# Patient Record
Sex: Female | Born: 1970 | Race: Black or African American | Hispanic: No | Marital: Married | Smoking: Never smoker
Health system: Southern US, Community
[De-identification: ages and names within clinical notes are randomized; demographics above are authoritative.]

## PROBLEM LIST (undated history)

## (undated) DIAGNOSIS — D649 Anemia, unspecified: Secondary | ICD-10-CM

## (undated) DIAGNOSIS — Z8489 Family history of other specified conditions: Secondary | ICD-10-CM

## (undated) DIAGNOSIS — C801 Malignant (primary) neoplasm, unspecified: Secondary | ICD-10-CM

---

## 2003-08-07 ENCOUNTER — Other Ambulatory Visit: Admission: RE | Admit: 2003-08-07 | Discharge: 2003-08-07 | Payer: Self-pay | Admitting: Family Medicine

## 2005-07-15 ENCOUNTER — Other Ambulatory Visit: Admission: RE | Admit: 2005-07-15 | Discharge: 2005-07-15 | Payer: Self-pay | Admitting: Family Medicine

## 2007-07-21 ENCOUNTER — Other Ambulatory Visit: Admission: RE | Admit: 2007-07-21 | Discharge: 2007-07-21 | Payer: Self-pay | Admitting: Family Medicine

## 2007-09-01 ENCOUNTER — Encounter: Admission: RE | Admit: 2007-09-01 | Discharge: 2007-09-01 | Payer: Self-pay | Admitting: Family Medicine

## 2008-12-05 ENCOUNTER — Encounter: Admission: RE | Admit: 2008-12-05 | Discharge: 2008-12-05 | Payer: Self-pay | Admitting: Family Medicine

## 2009-05-13 ENCOUNTER — Other Ambulatory Visit: Admission: RE | Admit: 2009-05-13 | Discharge: 2009-05-13 | Payer: Self-pay | Admitting: Family Medicine

## 2009-12-27 ENCOUNTER — Encounter: Admission: RE | Admit: 2009-12-27 | Discharge: 2009-12-27 | Payer: Self-pay | Admitting: Family Medicine

## 2010-10-05 ENCOUNTER — Encounter: Payer: Self-pay | Admitting: Family Medicine

## 2010-12-25 ENCOUNTER — Other Ambulatory Visit: Payer: Self-pay | Admitting: Family Medicine

## 2010-12-25 DIAGNOSIS — Z1231 Encounter for screening mammogram for malignant neoplasm of breast: Secondary | ICD-10-CM

## 2010-12-29 ENCOUNTER — Ambulatory Visit
Admission: RE | Admit: 2010-12-29 | Discharge: 2010-12-29 | Disposition: A | Discharge status: Home / Self Care | Payer: BC Managed Care – PPO | Source: Ambulatory Visit | Attending: Family Medicine | Admitting: Family Medicine

## 2010-12-29 DIAGNOSIS — Z1231 Encounter for screening mammogram for malignant neoplasm of breast: Secondary | ICD-10-CM

## 2013-02-10 ENCOUNTER — Other Ambulatory Visit (HOSPITAL_COMMUNITY)
Admission: RE | Admit: 2013-02-10 | Discharge: 2013-02-10 | Disposition: A | Discharge status: Home / Self Care | Payer: BC Managed Care – PPO | Source: Ambulatory Visit | Attending: Family Medicine | Admitting: Family Medicine

## 2013-02-10 DIAGNOSIS — Z01419 Encounter for gynecological examination (general) (routine) without abnormal findings: Secondary | ICD-10-CM | POA: Insufficient documentation

## 2013-02-10 DIAGNOSIS — Z1151 Encounter for screening for human papillomavirus (HPV): Secondary | ICD-10-CM | POA: Insufficient documentation

## 2014-07-04 ENCOUNTER — Other Ambulatory Visit (HOSPITAL_COMMUNITY)
Admission: RE | Admit: 2014-07-04 | Discharge: 2014-07-04 | Disposition: A | Discharge status: Home / Self Care | Payer: PRIVATE HEALTH INSURANCE | Source: Ambulatory Visit | Attending: Family Medicine | Admitting: Family Medicine

## 2014-07-04 DIAGNOSIS — Z1151 Encounter for screening for human papillomavirus (HPV): Secondary | ICD-10-CM | POA: Diagnosis present

## 2014-07-04 DIAGNOSIS — Z01419 Encounter for gynecological examination (general) (routine) without abnormal findings: Secondary | ICD-10-CM | POA: Insufficient documentation

## 2015-04-29 ENCOUNTER — Other Ambulatory Visit: Payer: Self-pay | Admitting: Obstetrics and Gynecology

## 2015-05-13 ENCOUNTER — Inpatient Hospital Stay (HOSPITAL_COMMUNITY)
Admission: RE | Admit: 2015-05-13 | Discharge: 2015-05-13 | Disposition: A | Discharge status: Home / Self Care | Payer: Self-pay | Source: Ambulatory Visit

## 2015-06-04 ENCOUNTER — Encounter (HOSPITAL_COMMUNITY): Payer: Self-pay

## 2015-06-04 ENCOUNTER — Encounter (HOSPITAL_COMMUNITY)
Admission: RE | Admit: 2015-06-04 | Discharge: 2015-06-04 | Disposition: A | Discharge status: Home / Self Care | Payer: 59 | Source: Ambulatory Visit | Attending: Obstetrics and Gynecology | Admitting: Obstetrics and Gynecology

## 2015-06-04 DIAGNOSIS — Z01818 Encounter for other preprocedural examination: Secondary | ICD-10-CM | POA: Insufficient documentation

## 2015-06-04 HISTORY — DX: Anemia, unspecified: D64.9

## 2015-06-04 LAB — CBC
HCT: 38.9 % (ref 36.0–46.0)
Hemoglobin: 12.8 g/dL (ref 12.0–15.0)
MCH: 28.6 pg (ref 26.0–34.0)
MCHC: 32.9 g/dL (ref 30.0–36.0)
MCV: 87 fL (ref 78.0–100.0)
PLATELETS: 275 10*3/uL (ref 150–400)
RBC: 4.47 MIL/uL (ref 3.87–5.11)
RDW: 14.4 % (ref 11.5–15.5)
WBC: 4.9 10*3/uL (ref 4.0–10.5)

## 2015-06-04 LAB — BASIC METABOLIC PANEL
Anion gap: 6 (ref 5–15)
BUN: 14 mg/dL (ref 6–20)
CALCIUM: 9.5 mg/dL (ref 8.9–10.3)
CO2: 26 mmol/L (ref 22–32)
CREATININE: 0.93 mg/dL (ref 0.44–1.00)
Chloride: 106 mmol/L (ref 101–111)
GFR calc non Af Amer: >60 mL/min (ref >60)
Glucose, Bld: 89 mg/dL (ref 65–99)
Potassium: 3.8 mmol/L (ref 3.5–5.1)
SODIUM: 138 mmol/L (ref 135–145)

## 2015-06-04 NOTE — Patient Instructions (Signed)
Your procedure is scheduled on:  June 14, 2015  Enter through the Main Entrance of [facility] at:  8:15 am   Pick up the phone at the desk and dial [phone].  Call this number if you have problems the morning of surgery: [phone].  Remember: Do NOT eat food:  After midnight Thursday  Do NOT drink clear liquids after:  Midnight on Thursday  Take these medicines the morning of surgery with a SIP OF WATER:  None   Do NOT wear jewelry (body piercing), metal hair clips/bobby pins, make-up, or nail polish. Do NOT wear lotions, powders, or perfumes.  You may wear deoderant. Do NOT shave for 48 hours prior to surgery. Do NOT bring valuables to the hospital. Contacts, dentures, or bridgework may not be worn into surgery. Leave suitcase in car.  After surgery it may be brought to your room.  For patients admitted to the hospital, checkout time is 11:00 AM the day of discharge.

## 2015-06-13 MED ORDER — DEXTROSE 5 % IV SOLN
2.0000 g | INTRAVENOUS | Status: AC
  Administered 2015-06-14: 2 g via INTRAVENOUS
  Filled 2015-06-13: qty 2

## 2015-06-14 ENCOUNTER — Ambulatory Visit (HOSPITAL_COMMUNITY)
Admission: RE | Admit: 2015-06-14 | Discharge: 2015-06-15 | Disposition: A | Discharge status: Home / Self Care | Payer: 59 | Source: Ambulatory Visit | Attending: Obstetrics and Gynecology | Admitting: Obstetrics and Gynecology

## 2015-06-14 ENCOUNTER — Encounter (HOSPITAL_COMMUNITY)
Admission: RE | Disposition: A | Discharge status: Home / Self Care | Payer: Self-pay | Source: Ambulatory Visit | Attending: Obstetrics and Gynecology

## 2015-06-14 ENCOUNTER — Ambulatory Visit (HOSPITAL_COMMUNITY): Payer: 59 | Admitting: Anesthesiology

## 2015-06-14 ENCOUNTER — Encounter (HOSPITAL_COMMUNITY): Payer: Self-pay | Admitting: Anesthesiology

## 2015-06-14 DIAGNOSIS — N920 Excessive and frequent menstruation with regular cycle: Secondary | ICD-10-CM | POA: Insufficient documentation

## 2015-06-14 DIAGNOSIS — D250 Submucous leiomyoma of uterus: Secondary | ICD-10-CM | POA: Insufficient documentation

## 2015-06-14 DIAGNOSIS — N879 Dysplasia of cervix uteri, unspecified: Secondary | ICD-10-CM | POA: Insufficient documentation

## 2015-06-14 DIAGNOSIS — Z91040 Latex allergy status: Secondary | ICD-10-CM | POA: Insufficient documentation

## 2015-06-14 DIAGNOSIS — D251 Intramural leiomyoma of uterus: Secondary | ICD-10-CM | POA: Insufficient documentation

## 2015-06-14 DIAGNOSIS — E669 Obesity, unspecified: Secondary | ICD-10-CM | POA: Insufficient documentation

## 2015-06-14 DIAGNOSIS — Z6834 Body mass index (BMI) 34.0-34.9, adult: Secondary | ICD-10-CM | POA: Insufficient documentation

## 2015-06-14 DIAGNOSIS — D509 Iron deficiency anemia, unspecified: Secondary | ICD-10-CM | POA: Insufficient documentation

## 2015-06-14 DIAGNOSIS — D252 Subserosal leiomyoma of uterus: Secondary | ICD-10-CM | POA: Insufficient documentation

## 2015-06-14 DIAGNOSIS — Z90710 Acquired absence of both cervix and uterus: Secondary | ICD-10-CM | POA: Diagnosis present

## 2015-06-14 HISTORY — PX: ROBOTIC ASSISTED TOTAL HYSTERECTOMY: SHX6085

## 2015-06-14 HISTORY — PX: LAPAROSCOPIC BILATERAL SALPINGECTOMY: SHX5889

## 2015-06-14 SURGERY — ROBOTIC ASSISTED TOTAL HYSTERECTOMY
Anesthesia: General

## 2015-06-14 MED ORDER — PROPOFOL 10 MG/ML IV BOLUS
INTRAVENOUS | Status: DC | PRN
  Administered 2015-06-14: 200 mg via INTRAVENOUS

## 2015-06-14 MED ORDER — KETOROLAC TROMETHAMINE 30 MG/ML IJ SOLN: 30.0000 mg | Freq: Four times a day (QID) | INTRAMUSCULAR | Status: DC

## 2015-06-14 MED ORDER — FENTANYL CITRATE (PF) 100 MCG/2ML IJ SOLN
INTRAMUSCULAR | Status: DC | PRN
  Administered 2015-06-14 (×6): 50 ug via INTRAVENOUS

## 2015-06-14 MED ORDER — LACTATED RINGERS IV SOLN
INTRAVENOUS | Status: DC
  Administered 2015-06-14 (×2): via INTRAVENOUS

## 2015-06-14 MED ORDER — IBUPROFEN 800 MG PO TABS
800.0000 mg | ORAL_TABLET | Freq: Three times a day (TID) | ORAL | Status: DC | PRN
  Administered 2015-06-15: 800 mg via ORAL
  Filled 2015-06-14: qty 1

## 2015-06-14 MED ORDER — HYDROMORPHONE HCL 1 MG/ML IJ SOLN
INTRAMUSCULAR | Status: AC
  Filled 2015-06-14: qty 1

## 2015-06-14 MED ORDER — HYDROMORPHONE HCL 1 MG/ML IJ SOLN
0.2000 mg | INTRAMUSCULAR | Status: DC | PRN
  Administered 2015-06-14 (×2): 0.6 mg via INTRAVENOUS
  Filled 2015-06-14 (×2): qty 1

## 2015-06-14 MED ORDER — NEOSTIGMINE METHYLSULFATE 10 MG/10ML IV SOLN
INTRAVENOUS | Status: AC
  Filled 2015-06-14: qty 1

## 2015-06-14 MED ORDER — MENTHOL 3 MG MT LOZG: 1.0000 | LOZENGE | OROMUCOSAL | Status: DC | PRN

## 2015-06-14 MED ORDER — FENTANYL CITRATE (PF) 250 MCG/5ML IJ SOLN
INTRAMUSCULAR | Status: AC
  Filled 2015-06-14: qty 25

## 2015-06-14 MED ORDER — ROPIVACAINE HCL 5 MG/ML IJ SOLN
INTRAMUSCULAR | Status: AC
  Filled 2015-06-14: qty 30

## 2015-06-14 MED ORDER — ONDANSETRON HCL 4 MG PO TABS: 4.0000 mg | ORAL_TABLET | Freq: Four times a day (QID) | ORAL | Status: DC | PRN

## 2015-06-14 MED ORDER — OXYCODONE-ACETAMINOPHEN 5-325 MG PO TABS: 1.0000 | ORAL_TABLET | ORAL | Status: DC | PRN

## 2015-06-14 MED ORDER — NEOSTIGMINE METHYLSULFATE 10 MG/10ML IV SOLN
INTRAVENOUS | Status: DC | PRN
  Administered 2015-06-14: 4 mg via INTRAVENOUS

## 2015-06-14 MED ORDER — SODIUM CHLORIDE 0.9 % IJ SOLN
INTRAMUSCULAR | Status: AC
  Filled 2015-06-14: qty 10

## 2015-06-14 MED ORDER — SIMETHICONE 80 MG PO CHEW: 80.0000 mg | CHEWABLE_TABLET | Freq: Four times a day (QID) | ORAL | Status: DC | PRN

## 2015-06-14 MED ORDER — VASOPRESSIN 20 UNIT/ML IV SOLN
INTRAVENOUS | Status: AC
  Filled 2015-06-14: qty 1

## 2015-06-14 MED ORDER — KETOROLAC TROMETHAMINE 30 MG/ML IJ SOLN
INTRAMUSCULAR | Status: DC | PRN
  Administered 2015-06-14: 30 mg via INTRAVENOUS

## 2015-06-14 MED ORDER — HYDROMORPHONE HCL 1 MG/ML IJ SOLN
0.2500 mg | INTRAMUSCULAR | Status: DC | PRN
  Administered 2015-06-14 (×3): 0.5 mg via INTRAVENOUS

## 2015-06-14 MED ORDER — PROMETHAZINE HCL 25 MG/ML IJ SOLN: 6.2500 mg | INTRAMUSCULAR | Status: DC | PRN

## 2015-06-14 MED ORDER — LIDOCAINE HCL (CARDIAC) 20 MG/ML IV SOLN
INTRAVENOUS | Status: DC | PRN
  Administered 2015-06-14: 70 mg via INTRAVENOUS
  Administered 2015-06-14: 30 mg via INTRAVENOUS

## 2015-06-14 MED ORDER — BUPIVACAINE HCL (PF) 0.25 % IJ SOLN
INTRAMUSCULAR | Status: AC
  Filled 2015-06-14: qty 30

## 2015-06-14 MED ORDER — SCOPOLAMINE 1 MG/3DAYS TD PT72
1.0000 | MEDICATED_PATCH | Freq: Once | TRANSDERMAL | Status: DC
  Administered 2015-06-14: 1.5 mg via TRANSDERMAL

## 2015-06-14 MED ORDER — MIDAZOLAM HCL 2 MG/2ML IJ SOLN
INTRAMUSCULAR | Status: DC | PRN
  Administered 2015-06-14: 2 mg via INTRAVENOUS

## 2015-06-14 MED ORDER — GLYCOPYRROLATE 0.2 MG/ML IJ SOLN
INTRAMUSCULAR | Status: DC | PRN
  Administered 2015-06-14: 0.6 mg via INTRAVENOUS

## 2015-06-14 MED ORDER — DEXTROSE IN LACTATED RINGERS 5 % IV SOLN: INTRAVENOUS | Status: DC

## 2015-06-14 MED ORDER — KETOROLAC TROMETHAMINE 30 MG/ML IJ SOLN
30.0000 mg | Freq: Four times a day (QID) | INTRAMUSCULAR | Status: DC
  Administered 2015-06-14: 30 mg via INTRAVENOUS
  Filled 2015-06-14: qty 1

## 2015-06-14 MED ORDER — SODIUM CHLORIDE 0.9 % IJ SOLN
INTRAMUSCULAR | Status: AC
  Filled 2015-06-14: qty 50

## 2015-06-14 MED ORDER — KETOROLAC TROMETHAMINE 30 MG/ML IJ SOLN
INTRAMUSCULAR | Status: AC
  Filled 2015-06-14: qty 1

## 2015-06-14 MED ORDER — PROPOFOL 10 MG/ML IV BOLUS
INTRAVENOUS | Status: AC
Start: 2015-06-14 — End: 2015-06-14
  Filled 2015-06-14: qty 20

## 2015-06-14 MED ORDER — ONDANSETRON HCL 4 MG/2ML IJ SOLN: 4.0000 mg | Freq: Four times a day (QID) | INTRAMUSCULAR | Status: DC | PRN

## 2015-06-14 MED ORDER — SCOPOLAMINE 1 MG/3DAYS TD PT72
MEDICATED_PATCH | TRANSDERMAL | Status: AC
  Administered 2015-06-14: 1.5 mg via TRANSDERMAL
  Filled 2015-06-14: qty 1

## 2015-06-14 MED ORDER — FENTANYL CITRATE (PF) 100 MCG/2ML IJ SOLN
INTRAMUSCULAR | Status: AC
  Filled 2015-06-14: qty 4

## 2015-06-14 MED ORDER — HYDROMORPHONE HCL 1 MG/ML IJ SOLN
INTRAMUSCULAR | Status: AC
  Administered 2015-06-14: 0.5 mg via INTRAVENOUS
  Filled 2015-06-14: qty 1

## 2015-06-14 MED ORDER — DEXAMETHASONE SODIUM PHOSPHATE 10 MG/ML IJ SOLN
INTRAMUSCULAR | Status: DC | PRN
  Administered 2015-06-14: 4 mg via INTRAVENOUS

## 2015-06-14 MED ORDER — ONDANSETRON HCL 4 MG/2ML IJ SOLN
INTRAMUSCULAR | Status: DC | PRN
  Administered 2015-06-14: 4 mg via INTRAVENOUS

## 2015-06-14 MED ORDER — MIDAZOLAM HCL 2 MG/2ML IJ SOLN
INTRAMUSCULAR | Status: AC
  Filled 2015-06-14: qty 4

## 2015-06-14 MED ORDER — DEXAMETHASONE SODIUM PHOSPHATE 4 MG/ML IJ SOLN
INTRAMUSCULAR | Status: AC
  Filled 2015-06-14: qty 1

## 2015-06-14 MED ORDER — KETOROLAC TROMETHAMINE 30 MG/ML IJ SOLN: 30.0000 mg | Freq: Once | INTRAMUSCULAR | Status: DC | PRN

## 2015-06-14 MED ORDER — GLYCOPYRROLATE 0.2 MG/ML IJ SOLN
INTRAMUSCULAR | Status: AC
  Filled 2015-06-14: qty 3

## 2015-06-14 MED ORDER — ROCURONIUM BROMIDE 100 MG/10ML IV SOLN
INTRAVENOUS | Status: AC
  Filled 2015-06-14: qty 1

## 2015-06-14 MED ORDER — LIDOCAINE HCL (CARDIAC) 20 MG/ML IV SOLN
INTRAVENOUS | Status: AC
  Filled 2015-06-14: qty 5

## 2015-06-14 MED ORDER — HEPARIN SODIUM (PORCINE) 5000 UNIT/ML IJ SOLN
INTRAMUSCULAR | Status: AC
  Filled 2015-06-14: qty 1

## 2015-06-14 MED ORDER — MEPERIDINE HCL 25 MG/ML IJ SOLN: 6.2500 mg | INTRAMUSCULAR | Status: DC | PRN

## 2015-06-14 MED ORDER — PANTOPRAZOLE SODIUM 40 MG PO TBEC
40.0000 mg | DELAYED_RELEASE_TABLET | Freq: Every day | ORAL | Status: DC
  Administered 2015-06-15: 40 mg via ORAL
  Filled 2015-06-14: qty 1

## 2015-06-14 MED ORDER — LACTATED RINGERS IR SOLN
Status: DC | PRN
  Administered 2015-06-14: 3000 mL

## 2015-06-14 MED ORDER — ROCURONIUM BROMIDE 100 MG/10ML IV SOLN
INTRAVENOUS | Status: DC | PRN
  Administered 2015-06-14: 15 mg via INTRAVENOUS
  Administered 2015-06-14: 10 mg via INTRAVENOUS
  Administered 2015-06-14: 40 mg via INTRAVENOUS

## 2015-06-14 MED ORDER — ONDANSETRON HCL 4 MG/2ML IJ SOLN
INTRAMUSCULAR | Status: AC
  Filled 2015-06-14: qty 2

## 2015-06-14 SURGICAL SUPPLY — 76 items
APPLICATOR COTTON TIP 6IN STRL (MISCELLANEOUS) ×4 IMPLANT
BARRIER ADHS 3X4 INTERCEED (GAUZE/BANDAGES/DRESSINGS) ×4 IMPLANT
CABLE HIGH FREQUENCY MONO STRZ (ELECTRODE) IMPLANT
CATH FOLEY 2W 5CC 18FR LF (CATHETERS) ×4 IMPLANT
CATH FOLEY 3WAY  5CC 16FR (CATHETERS) ×2
CATH FOLEY 3WAY 5CC 16FR (CATHETERS) ×2 IMPLANT
CATH ROBINSON RED A/P 16FR (CATHETERS) ×4 IMPLANT
CLOTH BEACON ORANGE TIMEOUT ST (SAFETY) ×4 IMPLANT
CONT PATH 16OZ SNAP LID 3702 (MISCELLANEOUS) ×4 IMPLANT
COVER BACK TABLE 60X90IN (DRAPES) ×8 IMPLANT
COVER TIP SHEARS 8 DVNC (MISCELLANEOUS) ×2 IMPLANT
COVER TIP SHEARS 8MM DA VINCI (MISCELLANEOUS) ×2
DECANTER SPIKE VIAL GLASS SM (MISCELLANEOUS) ×12 IMPLANT
DEVICE TROCAR PUNCTURE CLOSURE (ENDOMECHANICALS) IMPLANT
DRAPE WARM FLUID 44X44 (DRAPE) ×4 IMPLANT
DRSG COVADERM PLUS 2X2 (GAUZE/BANDAGES/DRESSINGS) ×8 IMPLANT
DRSG OPSITE POSTOP 3X4 (GAUZE/BANDAGES/DRESSINGS) ×8 IMPLANT
DURAPREP 26ML APPLICATOR (WOUND CARE) ×4 IMPLANT
ELECT LIGASURE LONG (ELECTRODE) IMPLANT
ELECT REM PT RETURN 9FT ADLT (ELECTROSURGICAL) ×4
ELECTRODE REM PT RTRN 9FT ADLT (ELECTROSURGICAL) ×2 IMPLANT
EVACUATOR SMOKE 8.L (FILTER) ×4 IMPLANT
EXTRT SYSTEM ALEXIS 17CM (MISCELLANEOUS)
FORCEPS CUTTING 33CM 5MM (CUTTING FORCEPS) IMPLANT
FORCEPS CUTTING 45CM 5MM (CUTTING FORCEPS) IMPLANT
GAUZE VASELINE 3X9 (GAUZE/BANDAGES/DRESSINGS) IMPLANT
GLOVE BIOGEL PI IND STRL 7.0 (GLOVE) ×8 IMPLANT
GLOVE BIOGEL PI INDICATOR 7.0 (GLOVE) ×8
GLOVE ECLIPSE 6.5 STRL STRAW (GLOVE) ×4 IMPLANT
GOWN STRL REUS W/TWL LRG LVL3 (GOWN DISPOSABLE) ×12 IMPLANT
KIT ACCESSORY DA VINCI DISP (KITS) ×2
KIT ACCESSORY DVNC DISP (KITS) ×2 IMPLANT
LEGGING LITHOTOMY PAIR STRL (DRAPES) ×4 IMPLANT
LIQUID BAND (GAUZE/BANDAGES/DRESSINGS) ×4 IMPLANT
NEEDLE INSUFFLATION 120MM (ENDOMECHANICALS) ×4 IMPLANT
NEEDLE INSUFFLATION 150MM (ENDOMECHANICALS) ×4 IMPLANT
NEEDLE SPNL 22GX7 QUINCKE BK (NEEDLE) IMPLANT
NS IRRIG 1000ML POUR BTL (IV SOLUTION) ×4 IMPLANT
OCCLUDER COLPOPNEUMO (BALLOONS) IMPLANT
PACK LAPAROSCOPY BASIN (CUSTOM PROCEDURE TRAY) ×4 IMPLANT
PACK ROBOT WH (CUSTOM PROCEDURE TRAY) ×4 IMPLANT
PACK ROBOTIC GOWN (GOWN DISPOSABLE) ×4 IMPLANT
PAD POSITIONING PINK XL (MISCELLANEOUS) ×4 IMPLANT
PAD PREP 24X48 CUFFED NSTRL (MISCELLANEOUS) ×8 IMPLANT
POUCH SPECIMEN RETRIEVAL 10MM (ENDOMECHANICALS) IMPLANT
SCISSORS LAP 5X35 DISP (ENDOMECHANICALS) IMPLANT
SET CYSTO W/LG BORE CLAMP LF (SET/KITS/TRAYS/PACK) IMPLANT
SET IRRIG TUBING LAPAROSCOPIC (IRRIGATION / IRRIGATOR) ×4 IMPLANT
SET TRI-LUMEN FLTR TB AIRSEAL (TUBING) IMPLANT
SOLUTION ELECTROLUBE (MISCELLANEOUS) IMPLANT
SUT VIC AB 0 CT1 36 (SUTURE) ×8 IMPLANT
SUT VIC AB 4-0 PS2 18 (SUTURE) ×8 IMPLANT
SUT VICRYL 0 UR6 27IN ABS (SUTURE) ×4 IMPLANT
SUT VICRYL 4-0 PS2 18IN ABS (SUTURE) ×8 IMPLANT
SUT VLOC 180 0 9IN  GS21 (SUTURE) ×4
SUT VLOC 180 0 9IN GS21 (SUTURE) ×4 IMPLANT
SYR 50ML LL SCALE MARK (SYRINGE) ×4 IMPLANT
SYRINGE 10CC LL (SYRINGE) ×4 IMPLANT
SYSTEM CONTND EXTRCTN KII BLLN (MISCELLANEOUS) IMPLANT
TIP RUMI ORANGE 6.7MMX12CM (TIP) IMPLANT
TIP UTERINE 5.1X6CM LAV DISP (MISCELLANEOUS) IMPLANT
TIP UTERINE 6.7X10CM GRN DISP (MISCELLANEOUS) ×4 IMPLANT
TIP UTERINE 6.7X6CM WHT DISP (MISCELLANEOUS) IMPLANT
TIP UTERINE 6.7X8CM BLUE DISP (MISCELLANEOUS) IMPLANT
TOWEL OR 17X24 6PK STRL BLUE (TOWEL DISPOSABLE) ×12 IMPLANT
TROCAR BALLN 12MMX100 BLUNT (TROCAR) IMPLANT
TROCAR DISP BLADELESS 8 DVNC (TROCAR) IMPLANT
TROCAR DISP BLADELESS 8MM (TROCAR)
TROCAR OPTI TIP 5M 100M (ENDOMECHANICALS) ×8 IMPLANT
TROCAR PORT AIRSEAL 5X120 (TROCAR) IMPLANT
TROCAR XCEL 12X100 BLDLESS (ENDOMECHANICALS) ×4 IMPLANT
TROCAR XCEL DIL TIP R 11M (ENDOMECHANICALS) ×4 IMPLANT
TROCAR XCEL NON-BLD 5MMX100MML (ENDOMECHANICALS) ×8 IMPLANT
TROCAR Z-THREAD 12X150 (TROCAR) ×4 IMPLANT
WARMER LAPAROSCOPE (MISCELLANEOUS) ×4 IMPLANT
WATER STERILE IRR 1000ML POUR (IV SOLUTION) ×12 IMPLANT

## 2015-06-14 NOTE — Addendum Note (Signed)
Addendum  created 06/14/15 1651 by Raenette Rover, CRNA   Modules edited: Notes Section   Notes Section:  File: [id number]

## 2015-06-14 NOTE — Brief Op Note (Signed)
06/14/2015  1:26 PM  PATIENT:  Mercedes Fisher  44 y.o. female  PRE-OPERATIVE DIAGNOSIS:  Symptomatic Uterine Fibroids  POST-OPERATIVE DIAGNOSIS:  Symptomatic Uterine Fibroids  PROCEDURE:  Davinci robotic total hysterectomy, bilateral salpngectomy  SURGEON:  Surgeon(s) and Role:    * Servando Salina, MD - Primary  PHYSICIAN ASSISTANT:   ASSISTANTS: Rolitta dawson, CNM  ANESTHESIA:   general Findings; nl tubes and ovaries, fibroid uterus EBL:  Total I/O In: 1600 [I.V.:1600] Out: 400 [Urine:300; Blood:100]  BLOOD ADMINISTERED:none  DRAINS: none   LOCAL MEDICATIONS USED:  MARCAINE    and BUPIVICAINE   SPECIMEN:  Source of Specimen:  uterus with cervix, tubes  DISPOSITION OF SPECIMEN:  PATHOLOGY  COUNTS:  YES  TOURNIQUET:  * No tourniquets in log *  DICTATION: .Other Dictation: Dictation Number [id number]  PLAN OF CARE: Admit for overnight observation  PATIENT DISPOSITION:  PACU - hemodynamically stable.   Delay start of Pharmacological VTE agent (>24hrs) due to surgical blood loss or risk of bleeding: no

## 2015-06-14 NOTE — Anesthesia Postprocedure Evaluation (Signed)
  Anesthesia Post-op Note  Patient: Mercedes Fisher  Procedure(s) Performed: Procedure(s): ROBOTIC ASSISTED TOTAL HYSTERECTOMY With Morcellation (N/A) LAPAROSCOPIC BILATERAL SALPINGECTOMY (Bilateral)  Patient Location: [facility]  Anesthesia Type:General  Level of Consciousness: awake, alert , oriented and patient cooperative  Airway and Oxygen Therapy: Patient Spontanous Breathing and Patient connected to nasal cannula oxygen  Post-op Pain: none  Post-op Assessment: Post-op Vital signs reviewed, Patient's Cardiovascular Status Stable, Respiratory Function Stable, Patent Airway and No signs of Nausea or vomiting              Post-op Vital Signs: Reviewed and stable  Last Vitals:  Filed Vitals:   06/14/15 1613  BP: 133/70  Pulse: 104  Temp: 36.7 C  Resp: 20    Complications: No apparent anesthesia complications

## 2015-06-14 NOTE — H&P (Signed)
Mercedes Fisher is an 44 y.o. female.G1P2 MBF presents for davinci robotic total hysterectomy, bilateral salpingectomy due to symptomatic uterine fibroids. Pt had associated IDA due to menorrhagia. ebx was benign. sono done showed multiple uterine fibroids. Pt has been on depolupron to facilitate shrinkage of large fibroid uterus  Pertinent Gynecological History: Menses: heavy flow Bleeding: menorrhagia Contraception: vasectomy DES exposure: denies Blood transfusions: none Sexually transmitted diseases: no past history Previous GYN Procedures: none  Last mammogram: normal Date: 2016 Last pap: normal Date:2015 OB History: G1P2  Menstrual History: Menarche age: n/a No LMP recorded.    Past Medical History  Diagnosis Date  . Anemia     took Iron supplements     History reviewed. No pertinent past surgical history.  History reviewed. No pertinent family history.  Social History:  reports that she has never smoked. She has never used smokeless tobacco. She reports that she does not drink alcohol or use illicit drugs.  Allergies:  Allergies  Allergen Reactions  . Latex Rash    No prescriptions prior to admission    Review of Systems  All other systems reviewed and are negative.   There were no vitals taken for this visit. Physical Exam  Constitutional: She is oriented to person, place, and time. She appears well-developed and well-nourished.  HENT:  Head: Atraumatic.  Eyes: EOM are normal.  Neck: Neck supple.  Cardiovascular: Regular rhythm.   Respiratory: Breath sounds normal.  GI: Soft.  Genitourinary: Vagina normal.  Musculoskeletal: Normal range of motion.  Neurological: She is alert and oriented to person, place, and time.  Skin: Skin is warm and dry.  Psychiatric: She has a normal mood and affect.  vulva nl  Vagina nl uterus 10-12 weeks size Adnexal nl palp  No results found.  Assessment/Plan: Symptomatic uterine fibroids P) davinci robotic total  hysterectomy, bilateral salpingectomy. Risk reviewed including infection, bleeding, poss need for blood transfusion and its risk, injury to surrounding organ structures, internal scar tissue, poss need for surgery in the future due to ovarian disease, poss need for morcellation, poss abdominal conversion. All? answered  Mercedes Fisher A 06/14/2015, 6:57 AM

## 2015-06-14 NOTE — Anesthesia Preprocedure Evaluation (Signed)
Anesthesia Evaluation  Patient identified by MRN, date of birth, ID band  Reviewed: Allergy & Precautions, H&P , NPO status , Patient's Chart, lab work & pertinent test results  Airway Mallampati: I  TM Distance: >3 FB Neck ROM: full    Dental no notable dental hx. (+) Teeth Intact   Pulmonary neg pulmonary ROS,    Pulmonary exam normal        Cardiovascular negative cardio ROS Normal cardiovascular exam     Neuro/Psych negative neurological ROS  negative psych ROS   GI/Hepatic negative GI ROS, Neg liver ROS,   Endo/Other  negative endocrine ROS  Renal/GU negative Renal ROS     Musculoskeletal   Abdominal (+) + obese,   Peds  Hematology   Anesthesia Other Findings   Reproductive/Obstetrics negative OB ROS                             Anesthesia Physical Anesthesia Plan  ASA: II  Anesthesia Plan: General   Post-op Pain Management:    Induction: Intravenous  Airway Management Planned: Oral ETT  Additional Equipment:   Intra-op Plan:   Post-operative Plan: Extubation in OR  Informed Consent: I have reviewed the patients History and Physical, chart, labs and discussed the procedure including the risks, benefits and alternatives for the proposed anesthesia with the patient or authorized representative who has indicated his/her understanding and acceptance.   Dental Advisory Given  Plan Discussed with: CRNA and Surgeon  Anesthesia Plan Comments:         Anesthesia Quick Evaluation

## 2015-06-14 NOTE — Anesthesia Postprocedure Evaluation (Signed)
  Anesthesia Post-op Note  Patient: Mercedes Fisher  Procedure(s) Performed: Procedure(s): ROBOTIC ASSISTED TOTAL HYSTERECTOMY With Morcellation (N/A) LAPAROSCOPIC BILATERAL SALPINGECTOMY (Bilateral)  Patient Location: PACU  Anesthesia Type:General  Level of Consciousness: awake, alert  and oriented  Airway and Oxygen Therapy: Patient Spontanous Breathing  Post-op Pain: mild  Post-op Assessment: Post-op Vital signs reviewed, Patient's Cardiovascular Status Stable, Respiratory Function Stable, Patent Airway, No signs of Nausea or vomiting and Pain level controlled              Post-op Vital Signs: Reviewed and stable  Last Vitals:  Filed Vitals:   06/14/15 1443  BP:   Pulse: 95  Temp:   Resp: 21    Complications: No apparent anesthesia complications

## 2015-06-14 NOTE — Anesthesia Procedure Notes (Signed)
Procedure Name: Intubation Date/Time: 06/14/2015 9:36 AM Performed by: Tobin Chad Pre-anesthesia Checklist: Timeout performed, Patient identified, Emergency Drugs available, Suction available and Patient being monitored Patient Re-evaluated:Patient Re-evaluated prior to inductionOxygen Delivery Method: Circle system utilized and Simple face mask Preoxygenation: Pre-oxygenation with 100% oxygen Intubation Type: IV induction and Inhalational induction Ventilation: Mask ventilation without difficulty Laryngoscope Size: Mac and 4 Grade View: Grade II Tube type: Oral Tube size: 7.0 mm Number of attempts: 3 Airway Equipment and Method: Stylet Placement Confirmation: positive ETCO2 Secured at: 23 (com) cm Difficulty Due To: Difficulty was unanticipated Future Recommendations: Recommend- induction with short-acting agent, and alternative techniques readily available

## 2015-06-14 NOTE — Transfer of Care (Signed)
Immediate Anesthesia Transfer of Care Note  Patient: Mercedes Fisher  Procedure(s) Performed: Procedure(s): ROBOTIC ASSISTED TOTAL HYSTERECTOMY With Morcellation (N/A) LAPAROSCOPIC BILATERAL SALPINGECTOMY (Bilateral)  Patient Location: PACU  Anesthesia Type:General  Level of Consciousness: sedated and patient cooperative  Airway & Oxygen Therapy: Patient Spontanous Breathing and Patient connected to face mask oxygen  Post-op Assessment: Report given to RN and Post -op Vital signs reviewed and stable  Post vital signs: Reviewed and stable  Last Vitals:  Filed Vitals:   06/14/15 0758  BP: 148/84  Pulse: 94  Temp: 36.8 C  Resp: 16    Complications: No apparent anesthesia complications

## 2015-06-15 LAB — CBC
HCT: 36.7 % (ref 36.0–46.0)
Hemoglobin: 12 g/dL (ref 12.0–15.0)
MCH: 28.4 pg (ref 26.0–34.0)
MCHC: 32.7 g/dL (ref 30.0–36.0)
MCV: 87 fL (ref 78.0–100.0)
PLATELETS: 256 10*3/uL (ref 150–400)
RBC: 4.22 MIL/uL (ref 3.87–5.11)
RDW: 13.9 % (ref 11.5–15.5)
WBC: 11.9 10*3/uL — ABNORMAL HIGH (ref 4.0–10.5)

## 2015-06-15 LAB — BASIC METABOLIC PANEL
Anion gap: 7 (ref 5–15)
BUN: 10 mg/dL (ref 6–20)
CALCIUM: 9 mg/dL (ref 8.9–10.3)
CO2: 25 mmol/L (ref 22–32)
CREATININE: 1 mg/dL (ref 0.44–1.00)
Chloride: 105 mmol/L (ref 101–111)
Glucose, Bld: 102 mg/dL — ABNORMAL HIGH (ref 65–99)
Potassium: 3.5 mmol/L (ref 3.5–5.1)
SODIUM: 137 mmol/L (ref 135–145)

## 2015-06-15 MED ORDER — IBUPROFEN 800 MG PO TABS: 800.0000 mg | ORAL_TABLET | Freq: Three times a day (TID) | ORAL | Status: DC | PRN

## 2015-06-15 MED ORDER — OXYCODONE-ACETAMINOPHEN 5-325 MG PO TABS: 1.0000 | ORAL_TABLET | ORAL | Status: DC | PRN

## 2015-06-15 NOTE — Progress Notes (Signed)
Subjective: Patient reports incisional pain, tolerating PO and no problems voiding.    Objective: I have reviewed patient's vital signs.  vital signs, intake and output, medications and labs. Filed Vitals:   06/15/15 0635  BP:   Pulse:   Temp: 99 F (37.2 C)  Resp: 18   I/O last 3 completed shifts: In: 3052.4 [P.O.:500; I.V.:2552.4] Out: 2054 [Urine:1954; Blood:100]    Lab Results  Component Value Date   WBC 11.9* 06/15/2015   HGB 12.0 06/15/2015   HCT 36.7 06/15/2015   MCV 87.0 06/15/2015   PLT 256 06/15/2015   Lab Results  Component Value Date   CREATININE 1.00 06/15/2015    EXAM General: alert, cooperative and no distress Resp: clear to auscultation bilaterally Cardio: regular rate and rhythm, S1, S2 normal, no murmur, click, rub or gallop GI: soft, non-tender; bowel sounds normal; no masses,  no organomegaly and incision: clean, dry and intact Extremities: no edema, redness or tenderness in the calves or thighs Vaginal Bleeding: none Back no CVAT  Assessment: s/p Procedure(s): ROBOTIC ASSISTED TOTAL HYSTERECTOMY With Morcellation LAPAROSCOPIC BILATERAL SALPINGECTOMY: stable, progressing well and tolerating diet  Plan: Advance diet Discontinue IV fluids Discharge home  d/c instructions reviewed  Sameena Artus A, MD 06/15/2015 10:42 AM    06/15/2015, 10:42 AM

## 2015-06-15 NOTE — Progress Notes (Signed)
Discharge teaching complete. Pt understood all information and did not have any questions. Pt ambulated out of the hospital and discharged home to family.  

## 2015-06-15 NOTE — Op Note (Signed)
NAMESHONTE, BEUTLER               ACCOUNT NO.:  [id number]  MEDICAL RECORD NO.:  [id number]  LOCATION:  9305                          FACILITY:  [facility]  PHYSICIAN:  Servando Salina, M.D.DATE OF BIRTH:  1971-02-01  DATE OF PROCEDURE:  06/14/2015 DATE OF DISCHARGE:                              OPERATIVE REPORT   PREOPERATIVE DIAGNOSIS:  Symptomatic uterine fibroids.  PROCEDURE:  Da Vinci robotic total hysterectomy, bilateral salpingectomy.  POSTOPERATIVE DIAGNOSES:  Intramural, subserosal, and submucosal fibroids.  Menorrhagia.  Iron-deficiency anemia.  ANESTHESIA:  General.  SURGEON:  Servando Salina, M.D.  ASSISTANT:  Laury Deep, CNM.  DESCRIPTION OF PROCEDURE:  Under adequate general anesthesia, the patient was placed in dorsal lithotomy position.  Examination under anesthesia revealed a bulky irregular 12-week size uterus.  The adnexal exam  was limited by the patient's body habitus.  Nonetheless, the patient was sterilely prepped and draped in usual fashion.  She was positioned for robotic surgery.  A 3-way Foley catheter was placed, subsequently draining pyridium-colored urine.  A weighted speculum was placed in the vagina.  Sims retractor was placed anteriorly.  0 Vicryl figure-of-eight sutures placed both on the anterior and on the posterior lip of the cervix.  The uterus sounded to 10 cm.  Cervix was gently dilated.  A #10 uterine manipulator with a small RUMI cup was introduced into the uterine cavity without incident, and retractor was removed. Attention was then turned to the abdomen.  Supraumbilical small incision was made after 0.25% Marcaine was injected.  Veress needle was introduced and tested.  3 L of CO2 was insufflated.  Veress needle was then removed.  A 12-mm disposable trocar with sleeve was introduced into the abdomen without incident.  The robotic camera port was then inserted.  Inspection revealed normal liver edge, bowel adhesions to  the anterior wall on the right.  The uterus manipulation showed that there was a wide-based bulky, but normal tubes and ovaries.  Additional port sites were then placed with plans to dock on the patient's left side, therefore two 8-mm robotic port sites were placed on the left and AirSeal 12 mm was used in the right lower quadrant and right side, the third robotic port was then placed; all under direct visualization.  The robot was then docked to the patient's left.  In arm #1, monopolar scissors; arm #2, the PK dissector; and arm #3 with a ProGrasp.  I then went to the surgical console.  At the surgical console, further inspection, both ureters were easily seen peristalsing.  There was no evidence of pelvic endometriosis in the anterior or posterior cul-de- sac.  The procedure was started on the left where the retroperitoneal space was opened.  The mesosalpinx of the fallopian tube was serially clamped, cauterized, and then cut and the tube removed.  A window was made in the broad ligament posteriorly, and the utero-ovarian ligament was serially clamped, cauterized, and then cut.  The round ligament was then clamped, cauterized, and cut.  The vesicouterine peritoneum was then opened transversely, anteriorly, and displaced inferiorly.  The limitations were that of the large bulky anterior uterus and the ability to get to the uterine vessels was much more  challenging on the left. Once the bladder was down, attention was then turned to the right side where the parietal space on the right was opened.  The defect in the broad ligament was then made, and the utero-ovarian ligament on the right was serially clamped, cauterized, and then cut.  Further skeletonization showed that the uterine vessels showed tortuousness of the appendix.  Nonetheless, the uterine vessels on the right were skeletonized, clamped, cauterized, and cut.  The vesicouterine peritoneum was opened transversely at the  cervicovaginal junction  that was carried around circumferentially.  The cervicovaginal junction was opened transversely and carried around to the right cauterizing as well along.  The left side was isolated with the uterine vessels which had been serially clamped, cauterized, and then cut.  The uterus was then severed posteriorly as well.  Due to the size of the uterus, decision was made to bivalve it in the fundal region and 2 dominant fibroids were noted.  The decrease in width was then allowed for the uterus to actually remove with the fibroid in place.  Once this was done, it was showed that the vaginal cuff was intact and had good hemostasis.  The arm #1 and arm #2 robotic instruments were replaced with a long-tip forceps and large needle driver.  0 V-Loc sutures were then inserted and the vaginal cuff was closed with V-Loc suture. The monopolar scissors and PK dissector was then reinserted and the right tube which remained was grasped and its mesosalpinx was serially clamped, cut and tube removed through assistant port. The pelvis was irrigated and suctioned. The vaginal cuff was palpated and was noted to be well approximated. The robotic instruments were removed. The robot was undocked. I then went back to patient's bedside. The pelvis was inspected. With good hemostasis, the robot ports were removed and the assistant port removed with removed after deflating the abdomen. The incisions were closed with 4-0 vicryl sutures and O-vicryl for fascia. The cuff was rechecked by myself and vagina showed no laceration.    SPECIMEN:  Uterus, cervix, and tube.  ESTIMATED BLOOD LOSS:  100 mL.  INTRAVENOUS FLUIDS:  1500 mL.  URINE OUTPUT:  300 mL.  COUNTS:  Sponge and instrument counts x2 were correct.  COMPLICATION:  None.  The patient tolerated the procedure well and was transferred to recovery in stable condition.      Servando Salina, M.D.     [facility]  D:  06/14/2015  T:   06/15/2015  Job:  [id number]

## 2015-06-15 NOTE — Discharge Summary (Signed)
Physician Discharge Summary  Patient ID: Mercedes Fisher MRN: [id number] DOB/AGE: [birth year] 44 y.o.  Admit date: 06/14/2015 Discharge date: 06/15/2015  Admission Diagnoses: symptomatic uterine fibroids  Discharge Diagnoses: symptomatic uterine fibroids Active Problems:   S/P total hysterectomy   Discharged Condition: stable  Hospital Course: admitted to [facility]. Pt underwent robotic total hysterectomy, bilateral salpingectomy. Uncomplicated postop course  Consults: None  Significant Diagnostic Studies: labs:  CBC Latest Ref Rng 06/15/2015 06/04/2015  WBC 4.0 - 10.5 K/uL 11.9(H) 4.9  Hemoglobin 12.0 - 15.0 g/dL 12.0 12.8  Hematocrit 36.0 - 46.0 % 36.7 38.9  Platelets 150 - 400 K/uL 256 275     Treatments: surgery: davinci robotic total hysterectomy, bilateral salpingectomy  Discharge Exam: Blood pressure 141/68, pulse 103, temperature 99 F (37.2 C), temperature source Oral, resp. rate 18, height 5\' 7"  (1.702 m), weight 98.884 kg (218 lb), last menstrual period 01/12/2015, SpO2 99 %. General appearance: alert, cooperative and no distress Back: no tenderness to percussion or palpation Resp: clear to auscultation bilaterally Cardio: regular rate and rhythm, S1, S2 normal, no murmur, click, rub or gallop GI: soft, non-tender; bowel sounds normal; no masses,  no organomegaly Pelvic: deferred Extremities: no edema, redness or tenderness in the calves or thighs Skin: Skin color, texture, turgor normal. No rashes or lesions Incision: well approximated d/c/i  Disposition: Final discharge disposition not confirmed  Discharge Instructions    Diet general    Complete by:  As directed      Discharge instructions    Complete by:  As directed   Call if temperature greater than equal to 100.4, nothing per vagina for 4-6 weeks or severe nausea vomiting, increased incisional pain , drainage or redness in the incision site, no straining with bowel movements, showers no bath     Discharge  patient    Complete by:  As directed      May walk up steps    Complete by:  As directed      No wound care    Complete by:  As directed             Medication List    STOP taking these medications        LUPRON IJ      TAKE these medications        ibuprofen 800 MG tablet  Commonly known as:  ADVIL,MOTRIN  Take 1 tablet (800 mg total) by mouth every 8 (eight) hours as needed (mild pain).     oxyCODONE-acetaminophen 5-325 MG tablet  Commonly known as:  PERCOCET/ROXICET  Take 1-2 tablets by mouth every 4 (four) hours as needed for severe pain (moderate to severe pain (when tolerating fluids)).           Follow-up Information    Follow up with Celester Lech A, MD In 2 weeks.   Specialty:  Obstetrics and Gynecology   Contact information:   [address] [place] [address] [phone]       Signed: Servando Salina A 06/15/2015, 10:48 AM

## 2015-06-15 NOTE — Discharge Instructions (Signed)
Call if temperature greater than equal to 100.4, nothing per vagina for 4-6 weeks or severe nausea vomiting, increased incisional pain , drainage or redness in the incision site, no straining with bowel movements, showers no bath °

## 2015-06-17 ENCOUNTER — Encounter (HOSPITAL_COMMUNITY): Payer: Self-pay | Admitting: Obstetrics and Gynecology

## 2016-07-14 ENCOUNTER — Other Ambulatory Visit: Payer: Self-pay | Admitting: Family Medicine

## 2016-07-15 ENCOUNTER — Other Ambulatory Visit: Payer: Self-pay | Admitting: Family Medicine

## 2016-07-15 ENCOUNTER — Ambulatory Visit
Admission: RE | Admit: 2016-07-15 | Discharge: 2016-07-15 | Disposition: A | Discharge status: Home / Self Care | Payer: PRIVATE HEALTH INSURANCE | Source: Ambulatory Visit | Attending: Family Medicine | Admitting: Family Medicine

## 2016-07-15 DIAGNOSIS — M542 Cervicalgia: Secondary | ICD-10-CM

## 2017-03-19 ENCOUNTER — Emergency Department (HOSPITAL_COMMUNITY): Payer: 59

## 2017-03-19 ENCOUNTER — Encounter (HOSPITAL_COMMUNITY): Payer: Self-pay

## 2017-03-19 ENCOUNTER — Emergency Department (HOSPITAL_COMMUNITY)
Admission: EM | Admit: 2017-03-19 | Discharge: 2017-03-19 | Disposition: A | Discharge status: Home / Self Care | Payer: 59 | Attending: Emergency Medicine | Admitting: Emergency Medicine

## 2017-03-19 DIAGNOSIS — Y929 Unspecified place or not applicable: Secondary | ICD-10-CM | POA: Diagnosis not present

## 2017-03-19 DIAGNOSIS — Y93I9 Activity, other involving external motion: Secondary | ICD-10-CM | POA: Insufficient documentation

## 2017-03-19 DIAGNOSIS — Y999 Unspecified external cause status: Secondary | ICD-10-CM | POA: Insufficient documentation

## 2017-03-19 DIAGNOSIS — R079 Chest pain, unspecified: Secondary | ICD-10-CM | POA: Insufficient documentation

## 2017-03-19 DIAGNOSIS — M25561 Pain in right knee: Secondary | ICD-10-CM | POA: Insufficient documentation

## 2017-03-19 DIAGNOSIS — M25571 Pain in right ankle and joints of right foot: Secondary | ICD-10-CM | POA: Insufficient documentation

## 2017-03-19 DIAGNOSIS — Z91040 Latex allergy status: Secondary | ICD-10-CM | POA: Diagnosis not present

## 2017-03-19 DIAGNOSIS — S66911A Strain of unspecified muscle, fascia and tendon at wrist and hand level, right hand, initial encounter: Secondary | ICD-10-CM | POA: Diagnosis not present

## 2017-03-19 DIAGNOSIS — S6991XA Unspecified injury of right wrist, hand and finger(s), initial encounter: Secondary | ICD-10-CM | POA: Diagnosis present

## 2017-03-19 MED ORDER — TRAMADOL HCL 50 MG PO TABS: 50.0000 mg | ORAL_TABLET | Freq: Four times a day (QID) | ORAL | 0 refills | Status: DC | PRN
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
## 2017-03-19 NOTE — Discharge Instructions (Signed)
Follow up with your md if needed °
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
## 2019-12-28 ENCOUNTER — Ambulatory Visit: Payer: 59 | Attending: Internal Medicine

## 2019-12-28 DIAGNOSIS — Z23 Encounter for immunization: Secondary | ICD-10-CM

## 2019-12-28 NOTE — Progress Notes (Signed)
   Covid-19 Vaccination Clinic  Name:  Mercedes Fisher    MRN: [id number] DOB: 1971/05/31  12/28/2019  [name] was observed post Covid-19 immunization for 15 minutes without incident. She was provided with Vaccine Information Sheet and instruction to access the V-Safe system.   [name] was instructed to call 911 with any severe reactions post vaccine: Marland Kitchen Difficulty breathing  . Swelling of face and throat  . A fast heartbeat  . A bad rash all over body  . Dizziness and weakness   Immunizations Administered    Name Date Dose VIS Date Route   Pfizer COVID-19 Vaccine 12/28/2019  9:34 AM 0.3 mL 08/25/2019 Intramuscular   Manufacturer: [place]   Lot: [id number]   [place]: [id number]

## 2020-01-22 ENCOUNTER — Ambulatory Visit: Payer: 59 | Attending: Internal Medicine

## 2020-01-22 DIAGNOSIS — Z23 Encounter for immunization: Secondary | ICD-10-CM

## 2020-01-22 NOTE — Progress Notes (Signed)
   Covid-19 Vaccination Clinic  Name:  Mercedes Fisher    MRN: [id number] DOB: [birth year]  01/22/2020  [name] was observed post Covid-19 immunization for 15 minutes without incident. She was provided with Vaccine Information Sheet and instruction to access the V-Safe system.   [name] was instructed to call 911 with any severe reactions post vaccine: Marland Kitchen Difficulty breathing  . Swelling of face and throat  . A fast heartbeat  . A bad rash all over body  . Dizziness and weakness   Immunizations Administered    Name Date Dose VIS Date Route   Pfizer COVID-19 Vaccine 01/22/2020  9:43 AM 0.3 mL 11/08/2018 Intramuscular   Manufacturer: [place]   Lot: [id number]   [place]: [id number]

## 2021-08-11 ENCOUNTER — Ambulatory Visit (INDEPENDENT_AMBULATORY_CARE_PROVIDER_SITE_OTHER): Payer: BC Managed Care – PPO | Admitting: Psychologist

## 2021-08-11 ENCOUNTER — Other Ambulatory Visit: Payer: Self-pay

## 2021-08-11 DIAGNOSIS — F411 Generalized anxiety disorder: Secondary | ICD-10-CM | POA: Diagnosis not present

## 2021-09-03 ENCOUNTER — Ambulatory Visit (INDEPENDENT_AMBULATORY_CARE_PROVIDER_SITE_OTHER): Payer: BC Managed Care – PPO | Admitting: Psychologist

## 2021-09-03 ENCOUNTER — Other Ambulatory Visit: Payer: Self-pay

## 2021-09-03 DIAGNOSIS — F411 Generalized anxiety disorder: Secondary | ICD-10-CM | POA: Diagnosis not present

## 2021-09-03 NOTE — Progress Notes (Signed)
[facility] Counselor/Therapist Progress Note  Patient ID: Mercedes Fisher, MRN: [id number],    Date: 09/03/2021  Time Spent: 3:02 pm to 3:46 pm; Total Time: 44 minutes   This session was held via in person. The patient consented to in-person therapy and was in the clinician's office. Limits of confidentiality were discussed with the patient.   Treatment Type: Individual Therapy  Reported Symptoms: Racing thoughts, feeling on edge, feeling restless  Mental Status Exam: Appearance:  Well Groomed     Behavior: Appropriate  Motor: Normal  Speech/Language:  Normal Rate  Affect: Appropriate  Mood: normal  Thought process: normal  Thought content:   WNL  Sensory/Perceptual disturbances:   WNL  Orientation: oriented to person, place, and time/date  Attention: Good  Concentration: Good  Memory: WNL  Fund of knowledge:  Fair  Insight:   Fair  Judgment:  Fair  Impulse Control: Good   Risk Assessment: Danger to Self:  No Self-injurious Behavior: No Danger to Others: No Duty to Warn:no Physical Aggression / Violence:No  Access to Firearms a concern: No  Gang Involvement:No   Subjective: Patient described herself as doing slightly better. Elaborating, she indicated that communication between her and her spouse has improved. She states that she still plans on leaving the marriage. Patient spent part of the session working on ways to continue improving communication with him. She also reflected on identifying different housing options that she can move into. She explored reasons as to why she may move in with her parents. She did acknowledge being afraid of the idea of "starting over" and explored what that looked like. She processed the different emotions she was experiencing. At the end of the session, patient was agreeable to plan discussed where she works with a therapist who has more training in couples counseling and couples relationship even when going through a  divorce. She denied suicidal and homicidal ideation.    Interventions:  Worked on developing a therapeutic relationship with the patient using active listening and reflective statements. Provided emotional support using empathy and validation. Reviewed the treatment plan with the patient. Reviewed events since the intake. Praised patient for doing slightly better. Explored what has assisted the patient. Used role play in the session. Used socratic questions to assist the patient gain insight into self. Challenged some of the thoughts expressed. Provided psychoeducation about communication styles. Explored willingness to write letter to spouse to express what she wants to state. Processed different housing options. Explored moving in with parents versus staying in current home as less stressful. Explored warning signs to get out of the current house. Normalized and validated expressed emotions. Discussed options for counseling moving forward. Specifically, processed how patient is expressing couples counseling needs from clinician not trained in couples counseling. Provided psychoeducation about different options. Discussed following up and working following moving forward through the divorce. Assessed for suicidal and homicidal ideation.   Homework: NA  Next Session: NA. Clinician to provide list of therapist who align better with patient current needs  Diagnosis: F41.1 generalized anxiety disorder.   Plan:   Client Abilities: Friendly and easy to develop rapport  Client Preferences: Couples counseling  Client statement of Needs: Work through interpersonal dynamics with spouse as working on separating and eventual divorce.   Treatment Level: outpatient   Goals Alleviate depressive symptoms Recognize, accept, and cope with depressive feelings Develop healthy thinking patterns Develop healthy interpersonal relationships Reduce overall frequency, intensity, and duration of anxiety Stabilize  anxiety level wile increasing ability  to function Enhance ability to effectively cope with full variety of stressors Learn and implement coping skills that result in a reduction of anxiety   Objectives: Target date for all objectives: 08/11/2022 Verbalize an understanding of the cognitive, physiological, and behavioral components of anxiety Learning and implement calming skills to reduce overall anxiety Verbalize an understanding of the role that cognitive biases play in excessive irrational worry and persistent anxiety symptoms Identify, challenge, and replace based fearful talk Learn and implement problem solving strategies Identify and engage in pleasant activities Learning and implement personal and interpersonal skills to reduce anxiety and improve interpersonal relationships Learn to accept limitations in life and commit to tolerating, rather than avoiding, unpleasant emotions while accomplishing meaningful goals Identify major life conflicts from the past and present that form the basis for present anxiety Maintain involvement in work, family, and social activities Reestablish a consistent sleep-wake cycle Cooperate with a medical evaluation  Cooperate with a medication evaluation by a physician Verbalize an accurate understanding of depression Verbalize an understanding of the treatment Identify and replace thoughts that support depression Learn and implement behavioral strategies Verbalize an understanding and resolution of current interpersonal problems Learn and implement problem solving and decision making skills Learn and implement conflict resolution skills to resolve interpersonal problems Verbalize an understanding of healthy and unhealthy emotions verbalize insight into how past relationships may be influence current experiences with depression Use mindfulness and acceptance strategies and increase value based behavior  Increase hopeful statements about the future.   Interventions Engage the patient in behavioral activation Use instruction, modeling, and role-playing to build the client's general social, communication, and/or conflict resolution skills Use Acceptance and Commitment Therapy to help client accept uncomfortable realities in order to accomplish value-consistent goals Reinforce the client's insight into the role of his/her past emotional pain and present anxiety  Support the client in following through with work, family, and social activities Teach and implement sleep hygiene practices  Refer the patient to a physician for a psychotropic medication consultation Monito the clint's psychotropic medication compliance Discuss how anxiety typically involves excessive worry, various bodily expressions of tension, and avoidance of what is threatening that interact to maintain the problem  Teach the patient relaxation skills Assign the patient homework Discuss examples demonstrating that unrealistic worry overestimates the probability of threats and underestimates patient's ability  Assist the patient in analyzing his or her worries Help patient understand that avoidance is reinforcing  Consistent with treatment model, discuss how change in cognitive, behavioral, and interpersonal can help client alleviate depression CBT Behavioral activation help the client explore the relationship, nature of the dispute,  Help the client develop new interpersonal skills and relationships Conduct Problem so living therapy Teach conflict resolution skills Use a process-experiential approach Conduct TLDP Conduct ACT  Patient reviewed and agreed to treatment plan discussed on 09/03/2021.   Conception Chancy, PsyD

## 2022-08-14 ENCOUNTER — Ambulatory Visit: Payer: Self-pay | Admitting: Surgery

## 2022-08-14 DIAGNOSIS — D0511 Intraductal carcinoma in situ of right breast: Secondary | ICD-10-CM

## 2022-08-17 ENCOUNTER — Other Ambulatory Visit: Payer: Self-pay | Admitting: Radiation Oncology

## 2022-08-17 ENCOUNTER — Inpatient Hospital Stay
Admission: RE | Admit: 2022-08-17 | Discharge: 2022-08-17 | Disposition: A | Discharge status: Home / Self Care | Payer: Self-pay | Source: Ambulatory Visit | Attending: Radiation Oncology | Admitting: Radiation Oncology

## 2022-08-17 DIAGNOSIS — D0511 Intraductal carcinoma in situ of right breast: Secondary | ICD-10-CM

## 2022-08-17 NOTE — Progress Notes (Signed)
Radiation Oncology         [phone] ________________________________  Initial Outpatient Consultation  Name: Mercedes Fisher MRN: [id number]  Date: 08/18/2022  DOB: [birth year]  BM:WUXLK, Sherrill Raring, NP  Erroll Luna, MD   REFERRING PHYSICIAN: Erroll Luna, MD  DIAGNOSIS:    ICD-10-CM   1. Ductal carcinoma in situ (DCIS) of right breast  D05.11 Ambulatory referral to Hematology / Oncology       Cancer Staging  DCIS (ductal carcinoma in situ) of breast Staging form: Breast, AJCC 8th Edition - Clinical stage from 08/18/2022: Stage 0 (cTis (DCIS), cN0, cM0, ER+, PR+, HER2: Not Assessed) - Unsigned Stage prefix: Initial diagnosis Method of lymph node assessment: Clinical Nuclear grade: G2   Stage 0 (cTis (DCIS), cN0, cM0) Right Breast LIQ, Intermediate grade ductal carcinoma in-situ, ER+ / PR+ / Her2 not assessed  CHIEF COMPLAINT: Here to discuss management of right breast DCIS  HISTORY OF PRESENT ILLNESS::Mercedes Fisher is a 51 y.o. female who for a bilateral diagnostic mammogram on 07/14/22 which revealed a broad area of calcifications in the lower-inner  quadrant of the right breast, spanning a total of approximately 8.6 x  6.0 cm in the axial dimension, and 5.9 cm in the craniocaudal dimension. Within the area of calcifications, 2 specific areas of calcifications appeared dissimilar to the surrounding calcifications. Otherwise, no evidence of malignancy was seen in the left breast. No associated symptoms, if any, were reported at that time.   Biopsies of the lower inner superior and inferior right breast on date of 07/29/22 both showed intermediate grade ductal carcinoma in-situ with micropapillary features and associated microcalcifications.   -- Superior lower inner right breast biopsy: ER status: 99% positive with and PR status 70% positive, both with strong staining intensity, Her2 not assessed. -- Inferior lower inner right breast biopsy: ER status: 80% positive and PR  status: 95% positive, both with strong staining intensity.   Biopsies of the lower inner posterior and anterior right breast on 08/05/22 both showed intermediate grade ductal carcinoma in-situ with micropapillary, clinging, and papillary features and associated microcalcifications.  (No lymph nodes were examined).   Accordingly, the patient was referred to Dr. Brantley Stage on 08/14/22 to discuss surgical treatment options. Following discussion of the risks and benefits with Dr. Brantley Stage, the patient has opted to proceed with right breast conserving surgery. Dr. Brantley Stage has also placed referrals to medical oncology and genetics (given her family history of breast cancer in her paternal aunt and paternal grandmother).      She is here today with her supportive husband and mother. She has experienced mild soreness since the biopsy. She denies having any breast pain or swelling, nipple discharge, or bleeding. She is currently waiting on a call from Dr. Josetta Huddle office to schedule her surgery. She has an appointment with genetics next week. She does not have an appointment with medical oncology set up yet.  PREVIOUS RADIATION THERAPY: No  PAST MEDICAL HISTORY:  has a past medical history of Anemia.    PAST SURGICAL HISTORY: Past Surgical History:  Procedure Laterality Date   LAPAROSCOPIC BILATERAL SALPINGECTOMY Bilateral 06/14/2015   Procedure: LAPAROSCOPIC BILATERAL SALPINGECTOMY;  Surgeon: Servando Salina, MD;  Location: [facility];  Service: Gynecology;  Laterality: Bilateral;   ROBOTIC ASSISTED TOTAL HYSTERECTOMY N/A 06/14/2015   Procedure: ROBOTIC ASSISTED TOTAL HYSTERECTOMY With Morcellation;  Surgeon: Servando Salina, MD;  Location: [facility];  Service: Gynecology;  Laterality: N/A;    FAMILY HISTORY: family history is not on file.  SOCIAL  HISTORY:  reports that she has never smoked. She has never used smokeless tobacco. She reports that she does not drink alcohol and does not use  drugs.  ALLERGIES: Latex  MEDICATIONS:  Current Outpatient Medications  Medication Sig Dispense Refill   Multiple Vitamin (MULTIVITAMIN WITH MINERALS) TABS tablet Take 1 tablet by mouth daily.     ibuprofen (ADVIL,MOTRIN) 800 MG tablet Take 1 tablet (800 mg total) by mouth every 8 (eight) hours as needed (mild pain). (Patient not taking: Reported on 03/19/2017) 30 tablet 0   oxyCODONE-acetaminophen (PERCOCET/ROXICET) 5-325 MG tablet Take 1-2 tablets by mouth every 4 (four) hours as needed for severe pain (moderate to severe pain (when tolerating fluids)). (Patient not taking: Reported on 03/19/2017) 30 tablet 0   traMADol (ULTRAM) 50 MG tablet Take 1 tablet (50 mg total) by mouth every 6 (six) hours as needed. (Patient not taking: Reported on 08/18/2022) 20 tablet 0   No current facility-administered medications for this encounter.    REVIEW OF SYSTEMS: As above in HPI.   PHYSICAL EXAM:  height is _0  (1.702 m) and weight is 228 lb (103.4 kg). Her oral temperature is 98.3 F (36.8 C). Her blood pressure is 133/86 and her pulse is 83. Her respiration is 18 and oxygen saturation is 98%.   General: Alert and oriented, in no acute distress HEENT: Head is normocephalic. Extraocular movements are intact. Oropharynx is clear. Neck: Neck is supple, no palpable cervical or supraclavicular lymphadenopathy. Heart: Regular in rate and rhythm with no murmurs, rubs, or gallops. Chest: Clear to auscultation bilaterally, with no rhonchi, wheezes, or rales. Abdomen: Soft, nontender, nondistended, with no rigidity or guarding. Extremities: No cyanosis or edema. Lymphatics: see Neck Exam Skin: No concerning lesions. Musculoskeletal: symmetric strength and muscle tone throughout. Neurologic: Cranial nerves II through XII are grossly intact. No obvious focalities. Speech is fluent. Coordination is intact. Psychiatric: Judgment and insight are intact. Affect is appropriate. Breasts: Small biopsy lesion with  surrounding minimal induration appreciated on the right breast. No other palpable masses appreciated in the breasts or axillae.    ECOG = 0  0 - Asymptomatic (Fully active, able to carry on all predisease activities without restriction)  1 - Symptomatic but completely ambulatory (Restricted in physically strenuous activity but ambulatory and able to carry out work of a light or sedentary nature. For example, light housework, office work)  2 - Symptomatic, <50% in bed during the day (Ambulatory and capable of all self care but unable to carry out any work activities. Up and about more than 50% of waking hours)  3 - Symptomatic, >50% in bed, but not bedbound (Capable of only limited self-care, confined to bed or chair 50% or more of waking hours)  4 - Bedbound (Completely disabled. Cannot carry on any self-care. Totally confined to bed or chair)  5 - Death   Eustace Pen MM, Creech RH, Tormey DC, et al. [phone]). "Toxicity and response criteria of the [facility]". [place] Oncol. 5 (6): 649-55   LABORATORY DATA:  Lab Results  Component Value Date   WBC 11.9 (H) 06/15/2015   HGB 12.0 06/15/2015   HCT 36.7 06/15/2015   MCV 87.0 06/15/2015   PLT 256 06/15/2015   CMP     Component Value Date/Time   NA 137 06/15/2015 0500   K 3.5 06/15/2015 0500   CL 105 06/15/2015 0500   CO2 25 06/15/2015 0500   GLUCOSE 102 (H) 06/15/2015 0500   BUN 10  06/15/2015 0500   CREATININE 1.00 06/15/2015 0500   CALCIUM 9.0 06/15/2015 0500   GFRNONAA >60 06/15/2015 0500   GFRAA >60 06/15/2015 0500         RADIOGRAPHY:  07/14/2022 10:06 AM EDT  CLINICAL DATA:  Short-term follow-up for right breast calcifications.  EXAM: DIGITAL DIAGNOSTIC BILATERAL MAMMOGRAM WITH TOMOSYNTHESIS AND CAD  TECHNIQUE: Bilateral digital diagnostic mammography and breast tomosynthesis was performed. The images were evaluated with computer-aided detection.  COMPARISON:  Previous exam(s).  ACR  Breast Density Category b: There are scattered areas of fibroglandular density.  FINDINGS: Spot compression magnification images through the lower inner quadrant of the right breast demonstrates a broad area of scattered and clustered calcifications spanning a total of approximately 8.6 x 6.0 cm in the axial dimension, and 5.9 cm in the craniocaudal dimension. Within this broader area, there are 2 groups which are morphologically unique; one in the mid to posterior depth, are coarse and heterogeneous measuring 0.8 cm. The other is at approximately the same depth but just slightly superior to the coarse heterogeneous group. These appear punctate and round and measures 0.6 cm. No other suspicious calcifications, masses or areas of distortion are seen in the bilateral breasts.    IMPRESSION/PLAN: Ductal carcinoma in situ (DCIS) of the right breast  It was a pleasure meeting the patient and her family today. We discussed the risks, benefits, and side effects of radiotherapy. I recommend radiotherapy to the right breast to reduce her risk of locoregional recurrence by 2/3.  We discussed that radiation would take approximately 4 weeks to complete and that I would give the patient a few weeks to heal following surgery before starting treatment planning. We spoke about acute effects including skin irritation and fatigue as well as much less common late effects including internal organ injury or irritation. We spoke about the latest technology that is used to minimize the risk of late effects for patients undergoing radiotherapy to the breast or chest wall. No guarantees of treatment were given. The patient is enthusiastic about proceeding with treatment. I look forward to participating in the patient's care.  I will await her referral back to me for postoperative follow-up and eventual CT simulation/treatment planning.  Referral made to medical oncology today. Patient states she will reach out to Dr.  Josetta Huddle office to work on getting surgery scheduled.   On date of service, in total, I spent 60 minutes on this encounter. Patient was seen in person.   __________________________________________   Leona Singleton, PA   Eppie Gibson, MD  This document serves as a record of services personally performed by Eppie Gibson, MD. It was created on her behalf by Roney Mans, a trained medical scribe. The creation of this record is based on the scribe's personal observations and the provider's statements to them. This document has been checked and approved by the attending provider.

## 2022-08-17 NOTE — Progress Notes (Incomplete)
Location of Breast Cancer: Ductal carcinoma in situ (DCIS) of right breast   Histology per Pathology Report:  ADDENDUM: Pathology of the RIGHT breast biopsy revealed: A. BREAST, RIGHT, SITE 1, LOWER INNER QUADRANT POSTERIOR (TOP HAT CLIP), BIOPSY: Ductal carcinoma in situ (DCIS), intermediate nuclear grade, clinging, papillary, and micropapillary types with associated microcalcifications.  B. BREAST, RIGHT, SITE 2, LOWER INNER QUADRANT ANTERIOR (Q CLIP), BIOPSY: Ductal carcinoma in situ (DCIS), intermediate nuclear grade, clinging, papillary, and micropapillary types with associated micro calcifications.  Prognostic Markers in Cancer   Block Number:  A1   Results:   Estrogen Receptor: Positive (99%, strong)   Progesterone Receptor: Positive (70%, strong)     Block Number:  B5   Results:   Estrogen Receptor: Positive (80%, strong)   Progesterone Receptor: Positive (95%, strong)     Receptor Status: ER(99 Percent positive), PR (70 percent positive)  Did patient present with symptoms (if so, please note symptoms) or was this found on screening mammography?: abnormal mammogram  Past/Anticipated interventions by surgeon, if any: Dr. Brantley Stage 08-14-22 History of Present Illness: Mercedes Fisher is a 51 y.o. female who is seen today as an office consultation for evaluation of Breast Cancer .  Patient presents for evaluation of abnormal mammogram. Patient had an 8 cm x 6 cm area of calcifications right medial lower breast. Core biopsy showed DCIS. She had a total of 4 biopsies. When she was initially seen only the report from 2 could be imaged. I was able to locate the remainder of her images at a later time once I had all the information. 4 biopsies are done in the same quadrant. No family history of breast cancer. 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 No evidence of invasive disease. She had 2 first-degree relatives with breast cancer she states  1 in her 4s were in her 67s. These are on her paternal side where aunt and grandmother. No other cancers in the family. She has no complaints. Review of Systems: A complete review of systems was obtained from the patient. I have reviewed this information and discussed as appropriate with the patient. See HPI as well for other ROS.  Assessment and Plan:  Diagnoses and all orders for this visit:  Ductal carcinoma in situ (DCIS) of right breast - Ambulatory Referral to Oncology-Medical - Ambulatory Referral to Radiation Oncology - Ambulatory Referral to Cancer Genetics - [id number]    I was able to review the mammogram once I receive more information from [facility] There were actually a total of 4 biopsies taken of the exact same quadrant look like. All pathological features were similar and the area appeared to measure 8 x 6 cm. After reviewing the mammogram further the plan of action was lumpectomy. I discussed this with the patient this could be a bracketed lumpectomy. I do not feel MRI would add much to that management at this point in time after reviewing the films. She would like to conserve her breast and I feel an attempt at lumpectomy using a bracketed approach is cosmetically possible. On exam, she has large breasts therefore making this a possibility. I also discussed mastectomy with reconstruction is an option. I discussed the role of sentinel lymph node mapping which is not normally used for DCIS but she may have microinvasion necessitating that at a later time. Also, mag trace could be used but certainly with lumpectomy given the medial location and we can wait on final pathology. A long discussion about  the pathophysiology of breast cancer, DCIS, treatment options, the role of MRI, as well as adjuvant therapies. She wishes to proceed with right breast seed localized lumpectomy and a bracketed approach will be used to encompass this area. This may require two-stage but the possibility she may  for seeds. This can be arranged at the time of surgery with her radiology colleagues.The procedure has been discussed with the patient. Alternatives to surgery have been discussed with the patient. Risks of surgery include bleeding, Infection, Seroma formation, death, and the need for further surgery. The patient understands and wishes to proceed.  No follow-ups on file.  Kennieth Francois, MD  Cornett, Beryle Lathe, MD at 08/14/2022 12:23 PM EST   IMPRESSION: Stereotactic-guided biopsy of the right breast x2. No apparent complications.  Electronically Signed: By: Kristopher Oppenheim M.D. On: 08/05/2022 10:18 Procedure Note   Past/Anticipated interventions by medical oncology, if any:  Recommendations: The patient has a known diagnosis of RIGHT breast cancer and should follow her outlined treatment plan. Recommend breast MRI for further evaluation of extent of disease given multiplicity of findings if this will alter surgical management.  Surgical referral: At the patient's request, surgical consultation was canceled with Dr. Adair Laundry and referral was sent to [facility] on 08/04/2022. An appointment for surgical consultation has been scheduled for 08/14/2022, 1010, with Dr. Erroll Luna, [facility], [place].  Addendum reported by [facility], RT(R)(M) on 08/11/2022, 10:47.   Electronically Signed By: Kristopher Oppenheim M.D. On: 08/11/2022 11:54   Lymphedema issues, if any:  {:18581} {t:21944}   Pain issues, if any:  {:18581} {PAIN DESCRIPTION:21022940}  SAFETY ISSUES: Prior radiation? {:18581} Pacemaker/ICD? {:18581} Possible current pregnancy?{:18581} Is the patient on methotrexate? {:18581}  Current Complaints / other details:  ***

## 2022-08-18 ENCOUNTER — Other Ambulatory Visit: Payer: Self-pay | Admitting: Surgery

## 2022-08-18 ENCOUNTER — Ambulatory Visit
Admission: RE | Admit: 2022-08-18 | Discharge: 2022-08-18 | Disposition: A | Discharge status: Home / Self Care | Payer: BC Managed Care – PPO | Source: Ambulatory Visit | Attending: Radiation Oncology | Admitting: Radiation Oncology

## 2022-08-18 ENCOUNTER — Encounter: Payer: Self-pay | Admitting: Radiation Oncology

## 2022-08-18 ENCOUNTER — Other Ambulatory Visit: Payer: Self-pay

## 2022-08-18 DIAGNOSIS — D0511 Intraductal carcinoma in situ of right breast: Secondary | ICD-10-CM

## 2022-08-18 DIAGNOSIS — Z170 Estrogen receptor positive status [ER+]: Secondary | ICD-10-CM | POA: Diagnosis not present

## 2022-08-18 DIAGNOSIS — D0510 Intraductal carcinoma in situ of unspecified breast: Secondary | ICD-10-CM | POA: Insufficient documentation

## 2022-08-20 ENCOUNTER — Encounter: Payer: Self-pay | Admitting: Hematology and Oncology

## 2022-08-20 ENCOUNTER — Other Ambulatory Visit: Payer: Self-pay

## 2022-08-20 ENCOUNTER — Inpatient Hospital Stay: Payer: 59 | Attending: Hematology and Oncology | Admitting: Hematology and Oncology

## 2022-08-20 VITALS — BP 134/90 | HR 80 | Temp 97.7°F | Resp 16 | Ht 67.0 in | Wt 225.2 lb

## 2022-08-20 DIAGNOSIS — Z803 Family history of malignant neoplasm of breast: Secondary | ICD-10-CM | POA: Insufficient documentation

## 2022-08-20 DIAGNOSIS — D0511 Intraductal carcinoma in situ of right breast: Secondary | ICD-10-CM | POA: Diagnosis present

## 2022-08-20 DIAGNOSIS — D0510 Intraductal carcinoma in situ of unspecified breast: Secondary | ICD-10-CM

## 2022-08-20 NOTE — Progress Notes (Signed)
[facility] CONSULT NOTE  Patient Care Team: Berkley Harvey, NP as PCP - General (Nurse Practitioner)  CHIEF COMPLAINTS/PURPOSE OF CONSULTATION:  Newly diagnosed breast cancer  Assessment and plan   This is a very pleasant 51 year old female patient with newly diagnosed right breast DCIS, ER/PR positive referred to medical oncology for recommendations. Pathology review: I discussed with the patient the difference between DCIS and invasive breast cancer. It is considered a precancerous lesion. DCIS is classified as a Stage 0 breast cancer. It is generally detected through mammograms as calcifications. We discussed the significance of grades and its impact on prognosis. We also discussed the importance of ER and PR receptors and their implications to adjuvant treatment options. Prognosis of DCIS dependence on grade and degree of comedo necrosis. It is anticipated that if not treated, 20-30% of DCIS can develop into invasive breast cancer.  Recommendation: 1. Breast conserving surgery 2. Followed by adjuvant radiation therapy 3. Followed by antiestrogen therapy with tamoxifen/aromatase inhibitors based on menopausal status 5 years  Since she is likely perimenopausal, we have discussed about tamoxifen today Tamoxifen counseling: We discussed the risks and benefits of tamoxifen. These include but not limited to insomnia, hot flashes, mood changes, vaginal dryness, and weight gain. Although rare, serious side effects including endometrial cancer, risk of blood clots were also discussed. We strongly believe that the benefits far outweigh the risks. Patient understands these risks and consented to starting treatment. Planned treatment duration is 5 years.  She should return to clinic after patient had adjuvant radiation to initiate antiestrogen therapy.  All her questions were answered to the best my knowledge.  Thank you for consulting [place] the care of this patient.  Please do not  hesitate to contact us with any additional questions or concerns  HISTORY OF PRESENTING ILLNESS:  Mercedes Fisher 51 y.o. female is here because of recent diagnosis of right breast DCIS  I reviewed her records extensively and collaborated the history with the patient.  SUMMARY OF ONCOLOGIC HISTORY: Oncology History  DCIS (ductal carcinoma in situ) of breast  07/14/2022 Mammogram   Diagnostic bilateral mammogram done on July 14, 2022 showed a broad area of calcifications in the lower inner quadrant of the right breast.  Within this area of calcifications that are similar to the surrounding calcifications and warrant biopsy.  No evidence of malignancy in the left breast.   08/18/2022 Initial Diagnosis   DCIS (ductal carcinoma in situ) of breast    Pathology Results   Pathology results showed intermediate nuclear grade DCIS in both sides sites Top-Hat clip and a Q clip.  Prognostic showed ER/PR positivity.    Ms. hole is here for an initial visit with her mother.  She is now scheduled for surgery on January 3.   She reports family history of breast cancer in paternal aunt and paternal grandmother as well as another paternal cancer hence has genetic testing scheduled on Monday.  She is very healthy at baseline, denies any use of birth control or hormone replacement therapy.  She has 51 year old twins and had partial hysterectomy back in 2016.  She has some hot flashes but is not sure if she went through the process, likely perimenopausal.  Rest of the pertinent 10 point ROS reviewed and negative.  MEDICAL HISTORY:  Past Medical History:  Diagnosis Date   Anemia    took Iron supplements     SURGICAL HISTORY: Past Surgical History:  Procedure Laterality Date   LAPAROSCOPIC BILATERAL SALPINGECTOMY Bilateral  06/14/2015   Procedure: LAPAROSCOPIC BILATERAL SALPINGECTOMY;  Surgeon: Servando Salina, MD;  Location: [facility];  Service: Gynecology;  Laterality: Bilateral;   ROBOTIC ASSISTED  TOTAL HYSTERECTOMY N/A 06/14/2015   Procedure: ROBOTIC ASSISTED TOTAL HYSTERECTOMY With Morcellation;  Surgeon: Servando Salina, MD;  Location: [facility];  Service: Gynecology;  Laterality: N/A;    SOCIAL HISTORY: Social History   Socioeconomic History   Marital status: Married    Spouse name: Not on file   Number of children: Not on file   Years of education: Not on file   Highest education level: Not on file  Occupational History   Not on file  Tobacco Use   Smoking status: Never   Smokeless tobacco: Never  Substance and Sexual Activity   Alcohol use: No   Drug use: No   Sexual activity: Not on file  Other Topics Concern   Not on file  Social History Narrative   Not on file   Social Determinants of Health   Financial Resource Strain: Not on file  Food Insecurity: Not on file  Transportation Needs: Not on file  Physical Activity: Not on file  Stress: Not on file  Social Connections: Not on file  Intimate Partner Violence: Not on file    FAMILY HISTORY: History reviewed. No pertinent family history.  ALLERGIES:  is allergic to latex.  MEDICATIONS:  Current Outpatient Medications  Medication Sig Dispense Refill   Multiple Vitamin (MULTIVITAMIN WITH MINERALS) TABS tablet Take 1 tablet by mouth daily.     No current facility-administered medications for this visit.    REVIEW OF SYSTEMS:   Constitutional: Denies fevers, chills or abnormal night sweats Eyes: Denies blurriness of vision, double vision or watery eyes Ears, nose, mouth, throat, and face: Denies mucositis or sore throat Respiratory: Denies cough, dyspnea or wheezes Cardiovascular: Denies palpitation, chest discomfort or lower extremity swelling Gastrointestinal:  Denies nausea, heartburn or change in bowel habits Skin: Denies abnormal skin rashes Lymphatics: Denies new lymphadenopathy or easy bruising Neurological:Denies numbness, tingling or new weaknesses Behavioral/Psych: Mood is stable, no new  changes  Breast: Denies any palpable lumps or discharge All other systems were reviewed with the patient and are negative.  PHYSICAL EXAMINATION: ECOG PERFORMANCE STATUS: 0 - Asymptomatic  Vitals:   08/20/22 1029  BP: (!) 134/90  Pulse: 80  Resp: 16  Temp: 97.7 F (36.5 C)  SpO2: 100%   Filed Weights   08/20/22 1029  Weight: 225 lb 3.2 oz (102.2 kg)    GENERAL:alert, no distress and comfortable SKIN: skin color, texture, turgor are normal, no rashes or significant lesions EYES: normal, conjunctiva are pink and non-injected, sclera clear OROPHARYNX:no exudate, no erythema and lips, buccal mucosa, and tongue normal  NECK: supple, thyroid normal size, non-tender, without nodularity LYMPH:  no palpable lymphadenopathy in the cervical, axillary  LUNGS: clear to auscultation and percussion with normal breathing effort HEART: regular rate & rhythm and no murmurs and no lower extremity edema ABDOMEN:abdomen soft, non-tender and normal bowel sounds Musculoskeletal:no cyanosis of digits and no clubbing  PSYCH: alert & oriented x 3 with fluent speech NEURO: no focal motor/sensory deficits BREAST: No palpable nodules in breast. No palpable axillary or supraclavicular lymphadenopathy (exam performed in the presence of a chaperone)   LABORATORY DATA:  I have reviewed the data as listed Lab Results  Component Value Date   WBC 11.9 (H) 06/15/2015   HGB 12.0 06/15/2015   HCT 36.7 06/15/2015   MCV 87.0 06/15/2015   PLT 256  06/15/2015   Lab Results  Component Value Date   NA 137 06/15/2015   K 3.5 06/15/2015   CL 105 06/15/2015   CO2 25 06/15/2015    RADIOGRAPHIC STUDIES: I have personally reviewed the radiological reports and agreed with the findings in the report.  Time spent 45 minutes including history, physical exam, review of records, counseling and coordination of care and All questions were answered. The patient knows to call the clinic with any problems, questions or  concerns.    Benay Pike, MD 08/20/22

## 2022-08-24 ENCOUNTER — Other Ambulatory Visit: Payer: Self-pay

## 2022-08-24 ENCOUNTER — Encounter: Payer: Self-pay | Admitting: Licensed Clinical Social Worker

## 2022-08-24 ENCOUNTER — Inpatient Hospital Stay (HOSPITAL_BASED_OUTPATIENT_CLINIC_OR_DEPARTMENT_OTHER): Payer: 59 | Admitting: Licensed Clinical Social Worker

## 2022-08-24 ENCOUNTER — Inpatient Hospital Stay: Payer: 59

## 2022-08-24 ENCOUNTER — Other Ambulatory Visit: Payer: Self-pay | Admitting: Licensed Clinical Social Worker

## 2022-08-24 ENCOUNTER — Encounter: Payer: Self-pay | Admitting: *Deleted

## 2022-08-24 DIAGNOSIS — Z803 Family history of malignant neoplasm of breast: Secondary | ICD-10-CM | POA: Diagnosis not present

## 2022-08-24 DIAGNOSIS — D0510 Intraductal carcinoma in situ of unspecified breast: Secondary | ICD-10-CM

## 2022-08-24 DIAGNOSIS — Z800 Family history of malignant neoplasm of digestive organs: Secondary | ICD-10-CM

## 2022-08-24 LAB — GENETIC SCREENING ORDER

## 2022-08-24 NOTE — Progress Notes (Signed)
REFERRING PROVIDER: Erroll Luna, MD [address] [place],  [place] [address]  PRIMARY PROVIDER:  Berkley Harvey, NP  PRIMARY REASON FOR VISIT:  1. Ductal carcinoma in situ (DCIS) of breast, unspecified laterality   2. Family history of breast cancer      HISTORY OF PRESENT ILLNESS:   Ms. Mercedes Fisher, a 51 y.o. female, was seen for a [facility] cancer genetics consultation at the request of Dr. Brantley Stage due to a personal and family history of breast cancer.  Mercedes Fisher presents to clinic today to discuss the possibility of a hereditary predisposition to cancer, genetic testing, and to further clarify her future cancer risks, as well as potential cancer risks for family members.    CANCER HISTORY:  Oncology History  DCIS (ductal carcinoma in situ) of breast  07/14/2022 Mammogram   Diagnostic bilateral mammogram done on July 14, 2022 showed a broad area of calcifications in the lower inner quadrant of the right breast.  Within this area of calcifications that are similar to the surrounding calcifications and warrant biopsy.  No evidence of malignancy in the left breast.   08/18/2022 Initial Diagnosis   DCIS (ductal carcinoma in situ) of breast    Pathology Results   Pathology results showed intermediate nuclear grade DCIS in both sides sites Top-Hat clip and a Q clip.  Prognostic showed ER/PR positivity.    In 2023, at the age of 34, Ms. Mercedes Fisher was diagnosed with DCIS of the right breast. The treatment plan includes lumpectomy (scheduled 09/16/22), adjuvant radiation and adjuvant antiestrogen therapy.   RISK FACTORS:  Menarche was at age 77.  First live birth at age 3.  Ovaries intact: yes.  Hysterectomy: yes.  Menopausal status: perimenopausal.  HRT use: 0 years. Colonoscopy: yes; normal. Mammogram within the last year: yes.  Past Medical History:  Diagnosis Date   Anemia    took Iron supplements     Past Surgical History:  Procedure Laterality Date    LAPAROSCOPIC BILATERAL SALPINGECTOMY Bilateral 06/14/2015   Procedure: LAPAROSCOPIC BILATERAL SALPINGECTOMY;  Surgeon: Servando Salina, MD;  Location: [facility];  Service: Gynecology;  Laterality: Bilateral;   ROBOTIC ASSISTED TOTAL HYSTERECTOMY N/A 06/14/2015   Procedure: ROBOTIC ASSISTED TOTAL HYSTERECTOMY With Morcellation;  Surgeon: Servando Salina, MD;  Location: [facility];  Service: Gynecology;  Laterality: N/A;    FAMILY HISTORY:  We obtained a detailed, 4-generation family history.  Significant diagnoses are listed below: Family History  Problem Relation Age of Onset   Pancreatic cancer Paternal Aunt    Breast cancer Paternal Aunt        dx 70s   Breast cancer Paternal Grandmother        dx 24s   Ms. Dye has 1 son and 1 daughter, twins, age 42. She has 2 paternal half brothers.   Ms. Swiney mother is living at 64. No known cancers on her side of the family.  Ms. Micucci father is living at 19, he has 12 siblings. One aunt had breast cancer in her 16s and is living at 17. Another aunt had pancreatic cancer in her 39s and passed in her 37s. Another aunt and an uncle possibly had cancer. Paternal grandmother had breast cancer in her 61s and passed at 44.   Ms. Grennan is unaware of previous family history of genetic testing for hereditary cancer risks. There is no reported Ashkenazi Jewish ancestry. There is no known consanguinity.    GENETIC COUNSELING ASSESSMENT: Mercedes Fisher is a 51 y.o.  female with a personal and family history of breast cancer which is somewhat suggestive of a hereditary cancer syndrome and predisposition to cancer. We, therefore, discussed and recommended the following at today's visit.   DISCUSSION: We discussed that approximately 10% of breast cancer is hereditary. Most cases of hereditary breast cancer are associated with BRCA1/BRCA2 genes, although there are other genes associated with hereditary cancer as well. Cancers and risks are gene specific. We  discussed that testing is beneficial for several reasons including knowing about cancer risks, identifying potential screening and risk-reduction options that may be appropriate, and to understand if other family members could be at risk for cancer and allow them to undergo genetic testing.   We reviewed the characteristics, features and inheritance patterns of hereditary cancer syndromes. We also discussed genetic testing, including the appropriate family members to test, the process of testing, insurance coverage and turn-around-time for results. We discussed the implications of a negative, positive and/or variant of uncertain significant result. We recommended Mercedes Fisher pursue genetic testing for the [facility] BRCAPlus+CancerNext+RNA gene panel.   Based on Mercedes Fisher' family history of cancer, she meets medical criteria for genetic testing. Despite that she meets criteria, she may still have an out of pocket cost. We discussed that if her out of pocket cost for testing is over $100, the laboratory will call and confirm whether she wants to proceed with testing.  If the out of pocket cost of testing is less than $100 she will be billed by the genetic testing laboratory.   PLAN: After considering the risks, benefits, and limitations, Mercedes Fisher provided informed consent to pursue genetic testing and the blood sample was sent to [facility] for analysis of the BRCAPlus+CancerNext+RNA panel. Initial results should be available within approximately 1 weeks' time, at which point they will be disclosed by telephone to Mercedes Fisher, as will any additional recommendations warranted by these results. Mercedes Fisher will receive a summary of her genetic counseling visit and a copy of her results once available. This information will also be available in Epic.   Mercedes Fisher questions were answered to her satisfaction today. Our contact information was provided should additional questions or concerns arise. Thank you  for the referral and allowing [place] to share in the care of your patient.   [name], MS, [facility] Genetic Counselor [email or web address] Phone: [phone]  The patient was seen for a total of 20 minutes in face-to-face genetic counseling. Patient's mother was also present.  Dr. Grayland Ormond was available for discussion regarding this case.   _______________________________________________________________________ For Office Staff:  Number of people involved in session: 2 Was an Intern/ student involved with case: no

## 2022-08-27 ENCOUNTER — Inpatient Hospital Stay: Payer: 59 | Admitting: Licensed Clinical Social Worker

## 2022-08-27 NOTE — Progress Notes (Signed)
[facility] Work  Initial Assessment   Cloris Flippo is a 51 y.o. year old female contacted by phone. Clinical Social Work was referred by new patient protocol for assessment of psychosocial needs.   SDOH (Social Determinants of Health) assessments performed: Yes SDOH Interventions    Flowsheet Row Clinical Support from 08/27/2022 in [facility]  SDOH Interventions   Transportation Interventions Intervention Not Indicated       SDOH Screenings   Transportation Needs: No Transportation Needs (08/27/2022)  Tobacco Use: Low Risk  (08/24/2022)     Distress Screen completed: No    08/18/2022    7:39 AM  ON[facility]N DISTRESS SCREENING  Screening Type Initial Screening  Distress experienced in past week (1-10) 0      Family/Social Information:  Family members/support persons in your life? Family and Friends Transportation concerns: no  Employment: Working full time. 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 Yes. The waiting for each step has been difficult. Patient reported stressors: Adjusting to my illness Current coping skills/ strengths: Capable of independent living , Communication skills , and Supportive family/friends     SUMMARY: Current SDOH Barriers:  None noted today  Clinical Social Work Clinical Goal(s):  Patient will work with CSW for additional coping support  Interventions: Discussed common feeling and emotions when being diagnosed with cancer, and the importance of support during treatment Informed patient of the support team roles and support services at [facility] Provided [place] contact information and encouraged patient to call with any questions or  concerns   Follow Up Plan: Patient will come to support center for counseling related to adjustment on 09/03/22 Patient verbalizes understanding of plan: Yes    Tesslyn Baumert E Sevrin Sally, LCSW

## 2022-08-31 ENCOUNTER — Telehealth: Payer: Self-pay | Admitting: Licensed Clinical Social Worker

## 2022-09-03 ENCOUNTER — Encounter: Payer: Self-pay | Admitting: Licensed Clinical Social Worker

## 2022-09-03 ENCOUNTER — Inpatient Hospital Stay: Payer: 59 | Admitting: Licensed Clinical Social Worker

## 2022-09-03 ENCOUNTER — Encounter (HOSPITAL_BASED_OUTPATIENT_CLINIC_OR_DEPARTMENT_OTHER): Payer: Self-pay | Admitting: Surgery

## 2022-09-03 ENCOUNTER — Ambulatory Visit: Payer: Self-pay | Admitting: Licensed Clinical Social Worker

## 2022-09-03 DIAGNOSIS — Z1379 Encounter for other screening for genetic and chromosomal anomalies: Secondary | ICD-10-CM | POA: Insufficient documentation

## 2022-09-03 NOTE — Progress Notes (Signed)
[facility] CSW Counseling Note  Patient was referred by self. Treatment type: Individual  Presenting Concerns: Patient and/or family reports the following symptoms/concerns: stress Duration of problem: a few weeks; Severity of problem: mild   Orientation:oriented to person, place, time/date, and situation.   Affect: Appropriate and Congruent Risk of harm to self or others: No plan to harm self or others  Patient and/or Family's Strengths/Protective Factors: Social connections, Social and Emotional competence, and Concrete supports in place (healthy food, safe environments, etc.)Ability for insight  Capable of independent living  Communication skills  Motivation for treatment/growth      Goals Addressed: Patient will:  Reduce symptoms of: stress Increase healthy adjustment to current life circumstances   Progress towards Goals: Initial   Interventions: Interventions utilized:  Strength-based and Supportive      Assessment: Patient currently experiencing stress with new cancer diagnosis and waiting for surgery. Patient endorsed typical concerns with body changes, fear of recurrence, concern for family, and other cancer adjustment worries. CSW normalized feelings. Pt is already participating in positive coping strategies of meditation, journaling, being active, engaging in her regular activities, and talking with others.      Plan: Follow up with CSW: 3 weeks (post-surgery) Behavioral recommendations: continue positive coping skills Referral(s): Support group(s):  breast group , tai chi, yoga, peer mentor       Christeen Douglas, LCSW

## 2022-09-03 NOTE — Progress Notes (Signed)
HPI:   Ms. Alberta was previously seen in the [facility] clinic due to a personal and family history of cancer and concerns regarding a hereditary predisposition to cancer. Please refer to our prior cancer genetics clinic note for more information regarding our discussion, assessment and recommendations, at the time. Ms. Amaro recent genetic test results were disclosed to her, as were recommendations warranted by these results. These results and recommendations are discussed in more detail below.  CANCER HISTORY:  Oncology History  DCIS (ductal carcinoma in situ) of breast  07/14/2022 Mammogram   Diagnostic bilateral mammogram done on July 14, 2022 showed a broad area of calcifications in the lower inner quadrant of the right breast.  Within this area of calcifications that are similar to the surrounding calcifications and warrant biopsy.  No evidence of malignancy in the left breast.   08/18/2022 Initial Diagnosis   DCIS (ductal carcinoma in situ) of breast    Pathology Results   Pathology results showed intermediate nuclear grade DCIS in both sides sites Top-Hat clip and a Q clip.  Prognostic showed ER/PR positivity.     FAMILY HISTORY:  We obtained a detailed, 4-generation family history.  Significant diagnoses are listed below: Family History  Problem Relation Age of Onset   Pancreatic cancer Paternal Aunt    Breast cancer Paternal Aunt        dx 28s   Breast cancer Paternal Grandmother        dx 50s   Ms. Dumlao has 1 son and 1 daughter, twins, age 71. She has 2 paternal half brothers.    Ms. Abreu mother is living at 78. No known cancers on her side of the family.   Ms. Hartel father is living at 68, he has 12 siblings. One aunt had breast cancer in her 109s and is living at 24. Another aunt had pancreatic cancer in her 48s and passed in her 17s. Another aunt and an uncle possibly had cancer. Paternal grandmother had breast cancer in her 31s and passed at  42.    Ms. Boyson is unaware of previous family history of genetic testing for hereditary cancer risks. There is no reported Ashkenazi Jewish ancestry. There is no known consanguinity.      GENETIC TEST RESULTS:  The [facility] CancerNext+RNA Panel found no pathogenic mutations.   The CancerNext+RNAinsight gene panel offered by [facility] includes sequencing and rearrangement analysis for the following 36 genes: APC*, ATM*, AXIN2, BARD1, BMPR1A, BRCA1*, BRCA2*, BRIP1*, CDH1*, CDK4, CDKN2A, CHEK2*, DICER1, MLH1*, MSH2*, MSH3, MSH6*, MUTYH*, NBN, NF1*, NTHL1, PALB2*, PMS2*, PTEN*, RAD51C*, RAD51D*, RECQL, SMAD4, SMARCA4, STK11 and TP53* (sequencing and deletion/duplication); HOXB13, POLD1 and POLE (sequencing only); EPCAM and GREM1 (deletion/duplication only).   The test report has been scanned into EPIC and is located under the Molecular Pathology section of the Results Review tab.  A portion of the result report is included below for reference. Genetic testing reported out on 08/31/2022.     Even though a pathogenic variant was not identified, possible explanations for the cancer in the family may include: There may be no hereditary risk for cancer in the family. The cancers in Ms. Roper and/or her family may be sporadic/familial or due to other genetic and environmental factors. There may be a gene mutation in one of these genes that current testing methods cannot detect but that chance is small. There could be another gene that has not yet been discovered, or that we have not yet tested, that  is responsible for the cancer diagnoses in the family.  It is also possible there is a hereditary cause for the cancer in the family that Ms. Fallin did not inherit.  Therefore, it is important to remain in touch with cancer genetics in the future so that we can continue to offer Ms. Seamans the most up to date genetic testing.   ADDITIONAL GENETIC TESTING:  We discussed with Ms. Nichol that her  genetic testing was fairly extensive.  If there are additional relevant genes identified to increase cancer risk that can be analyzed in the future, we would be happy to discuss and coordinate this testing at that time.    CANCER SCREENING RECOMMENDATIONS:  Ms. Hafer test result is considered negative (normal).  This means that we have not identified a hereditary cause for her personal and family history of cancer at this time.   An individual's cancer risk and medical management are not determined by genetic test results alone. Overall cancer risk assessment incorporates additional factors, including personal medical history, family history, and any available genetic information that may result in a personalized plan for cancer prevention and surveillance. Therefore, it is recommended she continue to follow the cancer management and screening guidelines provided by her oncology and primary healthcare provider.  RECOMMENDATIONS FOR FAMILY MEMBERS:   Since she did not inherit a identifiable mutation in a cancer predisposition gene included on this panel, her children could not have inherited a known mutation from her in one of these genes. Individuals in this family might be at some increased risk of developing cancer, over the general population risk, due to the family history of cancer.  Individuals in the family should notify their providers of the family history of cancer. We recommend women in this family have a yearly mammogram beginning at age 34, or 13 years younger than the earliest onset of cancer, an annual clinical breast exam, and perform monthly breast self-exams.  Family members should have colonoscopies by at age 65, or earlier, as recommended by their providers. Other members of the family may still carry a pathogenic variant in one of these genes that Ms. Atlas did not inherit. Based on the family history, we recommend her paternal relatives, especially those closely related to her  aunt who had pancreatic cancer,  have genetic counseling and testing. Ms. Phillis will let us know if we can be of any assistance in coordinating genetic counseling and/or testing for this family member.    FOLLOW-UP:  Lastly, we discussed with Ms. Gautier that cancer genetics is a rapidly advancing field and it is possible that new genetic tests will be appropriate for her and/or her family members in the future. We encouraged her to remain in contact with cancer genetics on an annual basis so we can update her personal and family histories and let her know of advances in cancer genetics that may benefit this family.   Our contact number was provided. Ms. Hammitt questions were answered to her satisfaction, and she knows she is welcome to call us at anytime with additional questions or concerns.    [name], MS, [facility] Genetic Counselor [email or web address] Phone: [phone]

## 2022-09-03 NOTE — Telephone Encounter (Signed)
I contacted Ms. Aja to discuss her genetic testing results. No pathogenic variants were identified in the 36 genes analyzed. Detailed clinic note to follow.   The test report has been scanned into EPIC and is located under the Molecular Pathology section of the Results Review tab.  A portion of the result report is included below for reference.      [name], MS, [facility] Genetic Counselor [email or web address] Phone: [phone]

## 2022-09-15 ENCOUNTER — Ambulatory Visit
Admission: RE | Admit: 2022-09-15 | Discharge: 2022-09-15 | Disposition: A | Discharge status: Home / Self Care | Payer: 59 | Source: Ambulatory Visit | Attending: Surgery | Admitting: Surgery

## 2022-09-15 ENCOUNTER — Other Ambulatory Visit: Payer: Self-pay | Admitting: Surgery

## 2022-09-15 DIAGNOSIS — D0511 Intraductal carcinoma in situ of right breast: Secondary | ICD-10-CM

## 2022-09-15 HISTORY — PX: BREAST BIOPSY: SHX20

## 2022-09-15 NOTE — Progress Notes (Signed)
Pt given CHG soap and Ensure Presurgery drink (finish by 3086) with written instructions for each. Pt verbalized understanding     Enhanced Recovery after Surgery for Orthopedics Enhanced Recovery after Surgery is a protocol used to improve the stress on your body and your recovery after surgery.  Patient Instructions  The night before surgery:  No food after midnight. ONLY clear liquids after midnight  The day of surgery (if you do NOT have diabetes):  Drink ONE (1) Pre-Surgery Clear Ensure as directed.   This drink was given to you during your hospital  pre-op appointment visit. The pre-op nurse will instruct you on the time to drink the  Pre-Surgery Ensure depending on your surgery time. Finish the drink at the designated time by the pre-op nurse.  Nothing else to drink after completing the  Pre-Surgery Clear Ensure.  The day of surgery (if you have diabetes): Drink ONE (1) Gatorade 2 (G2) as directed. This drink was given to you during your hospital  pre-op appointment visit.  The pre-op nurse will instruct you on the time to drink the   Gatorade 2 (G2) depending on your surgery time. Color of the Gatorade may vary. Red is not allowed. Nothing else to drink after completing the  Gatorade 2 (G2).         If you have questions, please contact your surgeon's office.

## 2022-09-16 ENCOUNTER — Encounter (HOSPITAL_BASED_OUTPATIENT_CLINIC_OR_DEPARTMENT_OTHER): Payer: Self-pay | Admitting: Surgery

## 2022-09-16 ENCOUNTER — Ambulatory Visit
Admission: RE | Admit: 2022-09-16 | Discharge: 2022-09-16 | Disposition: A | Discharge status: Home / Self Care | Payer: 59 | Source: Ambulatory Visit | Attending: Surgery | Admitting: Surgery

## 2022-09-16 ENCOUNTER — Other Ambulatory Visit: Payer: Self-pay

## 2022-09-16 ENCOUNTER — Encounter (HOSPITAL_BASED_OUTPATIENT_CLINIC_OR_DEPARTMENT_OTHER)
Admission: RE | Disposition: A | Discharge status: Home / Self Care | Payer: Self-pay | Source: Home / Self Care | Attending: Surgery

## 2022-09-16 ENCOUNTER — Ambulatory Visit (HOSPITAL_BASED_OUTPATIENT_CLINIC_OR_DEPARTMENT_OTHER)
Admission: RE | Admit: 2022-09-16 | Discharge: 2022-09-16 | Disposition: A | Discharge status: Home / Self Care | Payer: BC Managed Care – PPO | Attending: Surgery | Admitting: Surgery

## 2022-09-16 ENCOUNTER — Ambulatory Visit (HOSPITAL_BASED_OUTPATIENT_CLINIC_OR_DEPARTMENT_OTHER): Payer: BC Managed Care – PPO | Admitting: Anesthesiology

## 2022-09-16 DIAGNOSIS — C50311 Malignant neoplasm of lower-inner quadrant of right female breast: Secondary | ICD-10-CM | POA: Diagnosis present

## 2022-09-16 DIAGNOSIS — D0511 Intraductal carcinoma in situ of right breast: Secondary | ICD-10-CM

## 2022-09-16 DIAGNOSIS — D649 Anemia, unspecified: Secondary | ICD-10-CM | POA: Diagnosis not present

## 2022-09-16 DIAGNOSIS — D759 Disease of blood and blood-forming organs, unspecified: Secondary | ICD-10-CM | POA: Insufficient documentation

## 2022-09-16 DIAGNOSIS — Z803 Family history of malignant neoplasm of breast: Secondary | ICD-10-CM | POA: Insufficient documentation

## 2022-09-16 DIAGNOSIS — D0510 Intraductal carcinoma in situ of unspecified breast: Secondary | ICD-10-CM

## 2022-09-16 HISTORY — PX: BREAST LUMPECTOMY WITH RADIOACTIVE SEED LOCALIZATION: SHX6424

## 2022-09-16 SURGERY — BREAST LUMPECTOMY WITH RADIOACTIVE SEED LOCALIZATION
Anesthesia: General | Site: Breast | Laterality: Right

## 2022-09-16 MED ORDER — ACETAMINOPHEN 500 MG PO TABS
1000.0000 mg | ORAL_TABLET | ORAL | Status: AC
  Administered 2022-09-16: 1000 mg via ORAL

## 2022-09-16 MED ORDER — FENTANYL CITRATE (PF) 100 MCG/2ML IJ SOLN
25.0000 ug | INTRAMUSCULAR | Status: DC | PRN
  Administered 2022-09-16 (×2): 50 ug via INTRAVENOUS

## 2022-09-16 MED ORDER — BUPIVACAINE HCL (PF) 0.25 % IJ SOLN
INTRAMUSCULAR | Status: DC | PRN
  Administered 2022-09-16: 30 mL

## 2022-09-16 MED ORDER — SODIUM CHLORIDE 0.9 % IV SOLN
INTRAVENOUS | Status: DC | PRN
  Administered 2022-09-16: 500 mL

## 2022-09-16 MED ORDER — GABAPENTIN 300 MG PO CAPS
300.0000 mg | ORAL_CAPSULE | ORAL | Status: AC
  Administered 2022-09-16: 300 mg via ORAL

## 2022-09-16 MED ORDER — DEXAMETHASONE SODIUM PHOSPHATE 10 MG/ML IJ SOLN
INTRAMUSCULAR | Status: AC
  Filled 2022-09-16: qty 1

## 2022-09-16 MED ORDER — CHLORHEXIDINE GLUCONATE CLOTH 2 % EX PADS: 6.0000 | MEDICATED_PAD | Freq: Once | CUTANEOUS | Status: DC

## 2022-09-16 MED ORDER — ONDANSETRON HCL 4 MG/2ML IJ SOLN
INTRAMUSCULAR | Status: AC
  Filled 2022-09-16: qty 2

## 2022-09-16 MED ORDER — BUPIVACAINE HCL (PF) 0.25 % IJ SOLN
INTRAMUSCULAR | Status: AC
  Filled 2022-09-16: qty 120

## 2022-09-16 MED ORDER — LIDOCAINE HCL (CARDIAC) PF 100 MG/5ML IV SOSY
PREFILLED_SYRINGE | INTRAVENOUS | Status: DC | PRN
  Administered 2022-09-16: 80 mg via INTRAVENOUS

## 2022-09-16 MED ORDER — FENTANYL CITRATE (PF) 100 MCG/2ML IJ SOLN: 25.0000 ug | INTRAMUSCULAR | Status: DC | PRN

## 2022-09-16 MED ORDER — KETOROLAC TROMETHAMINE 30 MG/ML IJ SOLN
INTRAMUSCULAR | Status: AC
  Filled 2022-09-16: qty 1

## 2022-09-16 MED ORDER — SODIUM CHLORIDE 0.9 % IV SOLN
INTRAVENOUS | Status: AC
  Filled 2022-09-16: qty 10

## 2022-09-16 MED ORDER — GLYCOPYRROLATE 0.2 MG/ML IJ SOLN
INTRAMUSCULAR | Status: DC | PRN
  Administered 2022-09-16: .2 mg via INTRAVENOUS

## 2022-09-16 MED ORDER — FENTANYL CITRATE (PF) 100 MCG/2ML IJ SOLN
INTRAMUSCULAR | Status: DC | PRN
  Administered 2022-09-16 (×2): 50 ug via INTRAVENOUS
  Administered 2022-09-16 (×2): 25 ug via INTRAVENOUS

## 2022-09-16 MED ORDER — PHENYLEPHRINE 80 MCG/ML (10ML) SYRINGE FOR IV PUSH (FOR BLOOD PRESSURE SUPPORT)
PREFILLED_SYRINGE | INTRAVENOUS | Status: AC
  Filled 2022-09-16: qty 10

## 2022-09-16 MED ORDER — LACTATED RINGERS IV SOLN: INTRAVENOUS | Status: DC

## 2022-09-16 MED ORDER — GLYCOPYRROLATE PF 0.2 MG/ML IJ SOSY
PREFILLED_SYRINGE | INTRAMUSCULAR | Status: AC
  Filled 2022-09-16: qty 1

## 2022-09-16 MED ORDER — OXYCODONE HCL 5 MG PO TABS
ORAL_TABLET | ORAL | Status: AC
  Filled 2022-09-16: qty 1

## 2022-09-16 MED ORDER — FENTANYL CITRATE (PF) 100 MCG/2ML IJ SOLN
INTRAMUSCULAR | Status: AC
  Filled 2022-09-16: qty 2

## 2022-09-16 MED ORDER — PHENYLEPHRINE HCL (PRESSORS) 10 MG/ML IV SOLN
INTRAVENOUS | Status: DC | PRN
  Administered 2022-09-16 (×2): 80 ug via INTRAVENOUS

## 2022-09-16 MED ORDER — OXYCODONE HCL 5 MG PO TABS
5.0000 mg | ORAL_TABLET | Freq: Four times a day (QID) | ORAL | Status: AC | PRN
  Administered 2022-09-16: 5 mg via ORAL

## 2022-09-16 MED ORDER — ACETAMINOPHEN 500 MG PO TABS
ORAL_TABLET | ORAL | Status: AC
  Filled 2022-09-16: qty 2

## 2022-09-16 MED ORDER — CEFAZOLIN SODIUM-DEXTROSE 2-4 GM/100ML-% IV SOLN
2.0000 g | INTRAVENOUS | Status: AC
  Administered 2022-09-16: 2 g via INTRAVENOUS

## 2022-09-16 MED ORDER — ONDANSETRON HCL 4 MG/2ML IJ SOLN
INTRAMUSCULAR | Status: DC | PRN
  Administered 2022-09-16: 4 mg via INTRAVENOUS

## 2022-09-16 MED ORDER — KETOROLAC TROMETHAMINE 30 MG/ML IJ SOLN
INTRAMUSCULAR | Status: DC | PRN
  Administered 2022-09-16: 30 mg via INTRAVENOUS

## 2022-09-16 MED ORDER — GABAPENTIN 300 MG PO CAPS
ORAL_CAPSULE | ORAL | Status: AC
  Filled 2022-09-16: qty 1

## 2022-09-16 MED ORDER — MIDAZOLAM HCL 2 MG/2ML IJ SOLN
INTRAMUSCULAR | Status: AC
  Filled 2022-09-16: qty 2

## 2022-09-16 MED ORDER — CEFAZOLIN SODIUM-DEXTROSE 2-4 GM/100ML-% IV SOLN
INTRAVENOUS | Status: AC
  Filled 2022-09-16: qty 100

## 2022-09-16 MED ORDER — OXYCODONE HCL 5 MG PO TABS: 5.0000 mg | ORAL_TABLET | Freq: Four times a day (QID) | ORAL | 0 refills | Status: DC | PRN

## 2022-09-16 MED ORDER — PROPOFOL 10 MG/ML IV BOLUS
INTRAVENOUS | Status: DC | PRN
  Administered 2022-09-16: 200 mg via INTRAVENOUS

## 2022-09-16 MED ORDER — MIDAZOLAM HCL 5 MG/5ML IJ SOLN
INTRAMUSCULAR | Status: DC | PRN
  Administered 2022-09-16: 2 mg via INTRAVENOUS

## 2022-09-16 MED ORDER — PROPOFOL 10 MG/ML IV BOLUS
INTRAVENOUS | Status: AC
  Filled 2022-09-16: qty 20

## 2022-09-16 MED ORDER — DEXAMETHASONE SODIUM PHOSPHATE 4 MG/ML IJ SOLN
INTRAMUSCULAR | Status: DC | PRN
  Administered 2022-09-16: 8 mg via INTRAVENOUS

## 2022-09-16 MED ORDER — LIDOCAINE 2% (20 MG/ML) 5 ML SYRINGE
INTRAMUSCULAR | Status: AC
  Filled 2022-09-16: qty 5

## 2022-09-16 SURGICAL SUPPLY — 51 items
APPLIER CLIP 11 MED OPEN (CLIP) ×1
APPLIER CLIP 9.375 MED OPEN (MISCELLANEOUS)
BINDER BREAST LRG (GAUZE/BANDAGES/DRESSINGS) IMPLANT
BINDER BREAST XLRG (GAUZE/BANDAGES/DRESSINGS) IMPLANT
BINDER BREAST XXLRG (GAUZE/BANDAGES/DRESSINGS) IMPLANT
BIOPATCH RED 1 DISK 7.0 (GAUZE/BANDAGES/DRESSINGS) IMPLANT
BLADE SURG 15 STRL LF DISP TIS (BLADE) ×1 IMPLANT
BLADE SURG 15 STRL SS (BLADE) ×1
CANISTER SUC SOCK COL 7IN (MISCELLANEOUS) IMPLANT
CANISTER SUCT 1200ML W/VALVE (MISCELLANEOUS) IMPLANT
CHLORAPREP W/TINT 26 (MISCELLANEOUS) ×1 IMPLANT
CLIP APPLIE 11 MED OPEN (CLIP) IMPLANT
CLIP APPLIE 9.375 MED OPEN (MISCELLANEOUS) IMPLANT
COVER BACK TABLE 60X90IN (DRAPES) ×1 IMPLANT
COVER MAYO STAND STRL (DRAPES) ×1 IMPLANT
COVER PROBE CYLINDRICAL 5X96 (MISCELLANEOUS) ×1 IMPLANT
DERMABOND ADVANCED .7 DNX12 (GAUZE/BANDAGES/DRESSINGS) ×1 IMPLANT
DRAIN CHANNEL 19F RND (DRAIN) IMPLANT
DRAPE LAPAROTOMY 100X72 PEDS (DRAPES) ×1 IMPLANT
DRAPE UTILITY XL STRL (DRAPES) ×1 IMPLANT
DRSG TEGADERM 4X4.75 (GAUZE/BANDAGES/DRESSINGS) IMPLANT
ELECT COATED BLADE 2.86 ST (ELECTRODE) ×1 IMPLANT
ELECT REM PT RETURN 9FT ADLT (ELECTROSURGICAL) ×1
ELECTRODE REM PT RTRN 9FT ADLT (ELECTROSURGICAL) ×1 IMPLANT
EVACUATOR SILICONE 100CC (DRAIN) IMPLANT
GLOVE BIOGEL PI IND STRL 8 (GLOVE) ×1 IMPLANT
GLOVE ECLIPSE 8.0 STRL XLNG CF (GLOVE) ×1 IMPLANT
GOWN STRL REUS W/ TWL LRG LVL3 (GOWN DISPOSABLE) ×2 IMPLANT
GOWN STRL REUS W/ TWL XL LVL3 (GOWN DISPOSABLE) ×1 IMPLANT
GOWN STRL REUS W/TWL LRG LVL3 (GOWN DISPOSABLE) ×2
GOWN STRL REUS W/TWL XL LVL3 (GOWN DISPOSABLE) ×1
HEMOSTAT ARISTA ABSORB 3G PWDR (HEMOSTASIS) IMPLANT
HEMOSTAT SNOW SURGICEL 2X4 (HEMOSTASIS) IMPLANT
KIT MARKER MARGIN INK (KITS) ×1 IMPLANT
NDL HYPO 25X1 1.5 SAFETY (NEEDLE) ×1 IMPLANT
NEEDLE HYPO 25X1 1.5 SAFETY (NEEDLE) ×1 IMPLANT
NS IRRIG 1000ML POUR BTL (IV SOLUTION) ×1 IMPLANT
PACK BASIN DAY SURGERY FS (CUSTOM PROCEDURE TRAY) ×1 IMPLANT
PENCIL SMOKE EVACUATOR (MISCELLANEOUS) ×1 IMPLANT
SLEEVE SCD COMPRESS KNEE MED (STOCKING) ×1 IMPLANT
SPIKE FLUID TRANSFER (MISCELLANEOUS) IMPLANT
SPONGE T-LAP 4X18 ~~LOC~~+RFID (SPONGE) ×1 IMPLANT
SUT ETHILON 2 0 FS 18 (SUTURE) IMPLANT
SUT MNCRL AB 4-0 PS2 18 (SUTURE) ×1 IMPLANT
SUT SILK 2 0 SH (SUTURE) IMPLANT
SUT VICRYL 3-0 CR8 SH (SUTURE) ×1 IMPLANT
SYR CONTROL 10ML LL (SYRINGE) ×1 IMPLANT
TOWEL GREEN STERILE FF (TOWEL DISPOSABLE) ×1 IMPLANT
TRAY FAXITRON CT DISP (TRAY / TRAY PROCEDURE) ×1 IMPLANT
TUBE CONNECTING 20X1/4 (TUBING) IMPLANT
YANKAUER SUCT BULB TIP NO VENT (SUCTIONS) IMPLANT

## 2022-09-16 NOTE — H&P (Signed)
History of Present Illness: Mercedes Fisher is a 52 y.o. female who is seen today as an office consultation for evaluation of Breast Cancer .   Patient presents for evaluation of abnormal mammogram. Patient had an 8 cm x 6 cm area of calcifications right medial lower breast. Core biopsy showed DCIS. She had a total of 4 biopsies. When she was initially seen only the report from 2 could be imaged. I was able to locate the remainder of her images at a later time once I had all the information. 4 biopsies are done in the same quadrant. No family history of breast cancer. 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 No evidence of invasive disease. She had 2 first-degree relatives with breast cancer she states 1 in her 1s were in her 86s. These are on her paternal side where aunt and grandmother. No other cancers in the family. She has no complaints. Review of Systems: A complete review of systems was obtained from the patient. I have reviewed this information and discussed as appropriate with the patient. See HPI as well for other ROS.    Medical History: History reviewed. No pertinent past medical history.  There is no problem list on file for this patient.  Past Surgical History:  Procedure Laterality Date  HYSTERECTOMY    No Known Allergies  Current Outpatient Medications on File Prior to Visit  Medication Sig Dispense Refill  ergocalciferol, vitamin D2, (VITAMIN D2 ORAL) Take by mouth  MAGNESIUM ORAL Take by mouth  multivitamin capsule Take 1 capsule by mouth once daily  UNABLE TO FIND Optimal omega  vitamin B complex (B COMPLEX 1 ORAL) Take by mouth   No current facility-administered medications on file prior to visit.   Family History  Problem Relation Age of Onset  High blood pressure (Hypertension) Mother  High blood pressure (Hypertension) Father  High blood pressure (Hypertension) Brother    Social History   Tobacco Use  Smoking Status  Never  Smokeless Tobacco Never    Social History   Socioeconomic History  Marital status: Single  Tobacco Use  Smoking status: Never  Smokeless tobacco: Never   Objective:   Vitals:  08/14/22 1018  BP: (!) 148/88  Weight: (!) 101.6 kg (224 lb)  Height: 170.2 cm ('5\' 7"'$ )   Body mass index is 35.08 kg/m.  Physical Exam Exam conducted with a chaperone present.  HENT:  Head: Normocephalic.  Cardiovascular:  Rate and Rhythm: Normal rate.  Pulmonary:  Effort: Pulmonary effort is normal.  Breath sounds: No stridor.  Chest:  Breasts: Right: Normal. No mass or nipple discharge.  Left: Normal. No mass or nipple discharge.  Musculoskeletal:  General: Normal range of motion.  Lymphadenopathy:  Upper Body:  Right upper body: No supraclavicular or axillary adenopathy.  Left upper body: No supraclavicular or axillary adenopathy.  Skin: General: Skin is warm.  Neurological:  General: No focal deficit present.  Mental Status: She is alert.  Psychiatric:  Mood and Affect: Mood normal.     Labs, Imaging and Diagnostic Testing:  A. BREAST, RIGHT, SITE 1, LOWER INNER QUADRANT POSTERIOR (TOP HAT CLIP), BIOPSY:  Ductal carcinoma in situ (DCIS), intermediate nuclear grade, clinging, papillary, and micropapillary types with associated microcalcifications. See comment.   B. BREAST, RIGHT, SITE 2, LOWER INNER QUADRANT ANTERIOR (Q CLIP), BIOPSY:  Ductal carcinoma in situ (DCIS), intermediate nuclear grade, clinging, papillary, and micropapillary types with associated microcalcifications. See comment.   ADDENDUM REPORT: 08/11/2022 11:54  ADDENDUM:  Pathology of the RIGHT breast biopsy revealed: A. BREAST, RIGHT,  SITE 1, LOWER INNER QUADRANT POSTERIOR (TOP HAT CLIP), BIOPSY:  Ductal carcinoma in situ (DCIS), intermediate nuclear grade,  clinging, papillary, and micropapillary types with associated  microcalcifications.   B. BREAST, RIGHT, SITE 2, LOWER INNER QUADRANT  ANTERIOR (Q CLIP),  BIOPSY: Ductal carcinoma in situ (DCIS), intermediate nuclear grade,  clinging, papillary, and micropapillary types with associated  microcalcifications.   This was found to be concordant by Dr. Kristopher Oppenheim.   At the patient's request, results and recommendations were relayed  to the patient, by phone by Randel Pigg, RN, navigator for [place], [facility] on 08/11/2022,  0930.   The patient stated she did well following the biopsy with no  significant bleeding or pain. Post biopsy instructions were reviewed  with the patient and all of her questions were answered. The patient  was encouraged to contact the navigator with any further questions  or concerns.   Recommendations: The patient has a known diagnosis of RIGHT breast  cancer and should follow her outlined treatment plan. Recommend  breast MRI for further evaluation of extent of disease given  multiplicity of findings if this will alter surgical management.   Surgical referral: At the patient's request, surgical consultation  was canceled with Dr. Adair Laundry and referral was sent to [facility] on 08/04/2022. An appointment for surgical  consultation has been scheduled for 08/14/2022, 1010, with Dr. Erroll Luna, [facility], [place].   Addendum reported by [facility], RT(R)(M) on 08/11/2022, 10:47.   Electronically Signed  By: Kristopher Oppenheim M.D.  On: 08/11/2022 11:54  DDENDUM REPORT: 08/11/2022 11:54  ADDENDUM: Pathology of the RIGHT breast biopsy revealed: A. BREAST, RIGHT, SITE 1, LOWER INNER QUADRANT POSTERIOR (TOP HAT CLIP), BIOPSY: Ductal carcinoma in situ (DCIS), intermediate nuclear grade, clinging, papillary, and micropapillary types with associated microcalcifications.  B. BREAST, RIGHT, SITE 2, LOWER INNER QUADRANT ANTERIOR (Q CLIP), BIOPSY: Ductal carcinoma in situ (DCIS), intermediate nuclear  grade, clinging, papillary, and micropapillary types with associated microcalcifications.  This was found to be concordant by Dr. Kristopher Oppenheim.  At the patient's request, results and recommendations were relayed to the patient, by phone by Randel Pigg, RN, navigator for [place], [facility] on 08/11/2022, 0930.  The patient stated she did well following the biopsy with no significant bleeding or pain. Post biopsy instructions were reviewed with the patient and all of her questions were answered. The patient was encouraged to contact the navigator with any further questions or concerns.  Recommendations: The patient has a known diagnosis of RIGHT breast cancer and should follow her outlined treatment plan. Recommend breast MRI for further evaluation of extent of disease given multiplicity of findings if this will alter surgical management.  Surgical referral: At the patient's request, surgical consultation was canceled with Dr. Adair Laundry and referral was sent to [facility] on 08/04/2022. An appointment for surgical consultation has been scheduled for 08/14/2022, 1010, with Dr. Erroll Luna, [facility], [place].  Addendum reported by [facility], RT(R)(M) on 08/11/2022, 10:47.  Electronically Signed By: Kristopher Oppenheim M.D. On: 08/11/2022 11:54  CLINICAL DATA: 52 year old female with recently diagnosed right breast DCIS presents for additional 2 area stereotactic biopsy to determine extent of disease.  EXAM: RIGHT BREAST STEREOTACTIC CORE NEEDLE BIOPSY x2  COMPARISON: Previous exam(s).  FINDINGS: The patient and I discussed the procedure of  stereotactic-guided biopsy including benefits and alternatives. We discussed the high likelihood of a successful procedure. We discussed the risks of the procedure including infection, bleeding, tissue injury, clip migration, and inadequate sampling. Informed  written consent was given. The usual time out protocol was performed immediately prior to the procedure.  Lesion quadrant: Lower inner quadrant  Using sterile technique and 1% Lidocaine as local anesthetic, under stereotactic guidance, a 9 gauge vacuum assisted device was used to perform core needle biopsy of calcifications in the lower inner quadrant of the right breast at posterior depth using a medial approach. Specimen radiograph was performed showing calcifications within several specimens. Specimens with calcifications are identified for pathology. At the conclusion of the procedure, a top hat shaped tissue marker clip was deployed into the biopsy cavity.  Lesion quadrant: Lower inner quadrant  Using sterile technique and 1% Lidocaine as local anesthetic, under stereotactic guidance, a 9 gauge vacuum assisted device was used to perform core needle biopsy of calcifications in the lower inner quadrant at anterior depth using a medial approach. Specimen radiograph was performed showing calcifications within several specimens. Specimens with calcifications are identified for pathology. At the conclusion of the procedure, a Q shaped tissue marker clip was deployed into the biopsy cavity.  Follow-up 2-view mammogram was performed and dictated separately.  IMPRESSION: Stereotactic-guided biopsy of the right breast x2. No apparent complications.  Electronically Signed: By: Kristopher Oppenheim M.D. On: 08/05/2022 10:18 Procedure Note  Danton Sewer, MD - 08/11/2022 Formatting of this note might be different from the original. ADDENDUM REPORT: 08/11/2022 11:54  ADDENDUM: Pathology of the RIGHT breast biopsy revealed: A. BREAST, RIGHT, SITE 1, LOWER INNER QUADRANT POSTERIOR (TOP HAT CLIP), BIOPSY: Ductal carcinoma in situ (DCIS), intermediate nuclear grade, clinging, papillary, and micropapillary types with associated microcalcifications.  B. BREAST, RIGHT, SITE 2, LOWER  INNER QUADRANT ANTERIOR (Q CLIP), BIOPSY: Ductal carcinoma in situ (DCIS), intermediate nuclear grade, clinging, papillary, and micropapillary types with associated microcalcifications.  This was found to be concordant by Dr. Kristopher Oppenheim.  At the patient's request, results and recommendations were relayed to the patient, by phone by Randel Pigg, RN, navigator for [place], [facility] on 08/11/2022, 0930.  The patient stated she did well following the biopsy with no significant bleeding or pain. Post biopsy instructions were reviewed with the patient and all of her questions were answered. The patient was encouraged to contact the navigator with any further questions or concerns.  Recommendations: The patient has a known diagnosis of RIGHT breast cancer and should follow her outlined treatment plan. Recommend breast MRI for further evaluation of extent of disease given multiplicity of findings if this will alter surgical management.  Surgical referral: At the patient's request, surgical consultation was canceled with Dr. Adair Laundry and referral was sent to [facility] on 08/04/2022. An appointment for surgical consultation has been scheduled for 08/14/2022, 1010, with Dr. Erroll Luna, [facility], [place].  Addendum reported by [facility], RT(R)(M) on 08/11/2022, 10:47.  Electronically Signed By: Kristopher Oppenheim M.D. On: 08/11/2022 11:54  CLINICAL DATA: 52 year old female with recently diagnosed right breast DCIS presents for additional 2 area stereotactic biopsy to determine extent of disease.  EXAM: RIGHT BREAST STEREOTACTIC CORE NEEDLE BIOPSY x2  COMPARISON: Previous exam(s).  FINDINGS: The patient and I discussed the procedure of stereotactic-guided biopsy including benefits and alternatives. We discussed the high likelihood of a successful procedure. We discussed the risks of the procedure  including infection, bleeding,  tissue injury, clip migration, and inadequate sampling. Informed written consent was given. The usual time out protocol was performed immediately prior to the procedure.  Lesion quadrant: Lower inner quadrant  Using sterile technique and 1% Lidocaine as local anesthetic, under stereotactic guidance, a 9 gauge vacuum assisted device was used to perform core needle biopsy of calcifications in the lower inner quadrant of the right breast at posterior depth using a medial approach. Specimen radiograph was performed showing calcifications within several specimens. Specimens with calcifications are identified for pathology. At the conclusion of the procedure, a top hat shaped tissue marker clip was deployed into the biopsy cavity.  Lesion quadrant: Lower inner quadrant  Using sterile technique and 1% Lidocaine as local anesthetic, under stereotactic guidance, a 9 gauge vacuum assisted device was used to perform core needle biopsy of calcifications in the lower inner quadrant at anterior depth using a medial approach. Specimen radiograph was performed showing calcifications within several specimens. Specimens with calcifications are identified for pathology. At the conclusion of the procedure, a Q shaped tissue marker clip was deployed into the biopsy cavity.  Follow-up 2-view mammogram was performed and dictated separately.  IMPRESSION: Stereotactic-guided biopsy of the right breast x2. No apparent complications.  Electronically Signed: By: Kristopher Oppenheim M.D. On: 08/05/2022 10:18  Assessment and Plan:   Diagnoses and all orders for this visit:  Ductal carcinoma in situ (DCIS) of right breast - Ambulatory Referral to Oncology-Medical - Ambulatory Referral to Radiation Oncology - Ambulatory Referral to Cancer Genetics - [id number]    I was able to review the mammogram once I receive [facility]. There were actually a total of 4  biopsies taken of the exact same quadrant look like. All pathological features were similar and the area appeared to measure 8 x 6 cm. After reviewing the mammogram further the plan of action was lumpectomy. I discussed this with the patient this could be a bracketed lumpectomy. I do not feel MRI would add much to that management at this point in time after reviewing the films. She would like to conserve her breast and I feel an attempt at lumpectomy using a bracketed approach is cosmetically possible. On exam, she has large breasts therefore making this a possibility. I also discussed mastectomy with reconstruction is an option. I discussed the role of sentinel lymph node mapping which is not normally used for DCIS but she may have microinvasion necessitating that at a later time. Also, mag trace could be used but certainly with lumpectomy given the medial location and we can wait on final pathology. A long discussion about the pathophysiology of breast cancer, DCIS, treatment options, the role of MRI, as well as adjuvant therapies. She wishes to proceed with right breast seed localized lumpectomy and a bracketed approach will be used to encompass this area. This may require two-stage but the possibility she may for seeds. This can be arranged at the time of surgery with her radiology colleagues.The procedure has been discussed with the patient. Alternatives to surgery have been discussed with the patient. Risks of surgery include bleeding, Infection, Seroma formation, death, and the need for further surgery. The patient understands and wishes to proceed.   No follow-ups on file.  Kennieth Francois, MD

## 2022-09-16 NOTE — Interval H&P Note (Signed)
History and Physical Interval Note:  09/16/2022 9:25 AM  Mercedes Fisher  has presented today for surgery, with the diagnosis of RIGHT BREAST DCIS.  The various methods of treatment have been discussed with the patient and family. After consideration of risks, benefits and other options for treatment, the patient has consented to  Procedure(s): RIGHT BREAST BRACKETED LUMPECTOMY WITH RADIOACTIVE SEED LOCALIZATION (Right) as a surgical intervention.  The patient's history has been reviewed, patient examined, no change in status, stable for surgery.  I have reviewed the patient's chart and labs.  Questions were answered to the patient's satisfaction.     [place]

## 2022-09-16 NOTE — Discharge Instructions (Addendum)
[facility] Office Phone Number [phone]  BREAST BIOPSY/ PARTIAL MASTECTOMY: POST OP INSTRUCTIONS  Always review your discharge instruction sheet given to you by the facility where your surgery was performed.  IF YOU HAVE DISABILITY OR FAMILY LEAVE FORMS, YOU MUST BRING THEM TO THE OFFICE FOR PROCESSING.  DO NOT GIVE THEM TO YOUR DOCTOR.  A prescription for pain medication may be given to you upon discharge.  Take your pain medication as prescribed, if needed.  If narcotic pain medicine is not needed, then you may take acetaminophen (Tylenol) or ibuprofen (Advil) as needed. Take your usually prescribed medications unless otherwise directed If you need a refill on your pain medication, please contact your pharmacy.  They will contact our office to request authorization.  Prescriptions will not be filled after 5pm or on week-ends. You should eat very light the first 24 hours after surgery, such as soup, crackers, pudding, etc.  Resume your normal diet the day after surgery. Most patients will experience some swelling and bruising in the breast.  Ice packs and a good support bra will help.  Swelling and bruising can take several days to resolve.  It is common to experience some constipation if taking pain medication after surgery.  Increasing fluid intake and taking a stool softener will usually help or prevent this problem from occurring.  A mild laxative (Milk of Magnesia or Miralax) should be taken according to package directions if there are no bowel movements after 48 hours. Unless discharge instructions indicate otherwise, you may remove your bandages 24-48 hours after surgery, and you may shower at that time.  You may have steri-strips (small skin tapes) in place directly over the incision.  These strips should be left on the skin for 7-10 days.  If your surgeon used skin glue on the incision, you may shower in 24 hours.  The glue will flake off over the next 2-3 weeks.  Any  sutures or staples will be removed at the office during your follow-up visit. ACTIVITIES:  You may resume regular daily activities (gradually increasing) beginning the next day.  Wearing a good support bra or sports bra minimizes pain and swelling.  You may have sexual intercourse when it is comfortable. You may drive when you no longer are taking prescription pain medication, you can comfortably wear a seatbelt, and you can safely maneuver your car and apply brakes. RETURN TO WORK:  ______________________________________________________________________________________ You should see your doctor in the office for a follow-up appointment approximately two weeks after your surgery.  Your doctor's nurse will typically make your follow-up appointment when she calls you with your pathology report.  Expect your pathology report 2-3 business days after your surgery.  You may call to check if you do not hear from us after three days. OTHER INSTRUCTIONS: _______________________________________________________________________________________________ _____________________________________________________________________________________________________________________________________ _____________________________________________________________________________________________________________________________________ _____________________________________________________________________________________________________________________________________  WHEN TO CALL YOUR DOCTOR: Fever over 101.0 Nausea and/or vomiting. Extreme swelling or bruising. Continued bleeding from incision. Increased pain, redness, or drainage from the incision.  The clinic staff is available to answer your questions during regular business hours.  Please don't hesitate to call and ask to speak to one of the nurses for clinical concerns.  If you have a medical emergency, go to the nearest emergency room or call 911.  A surgeon from [facility] is always on call at the hospital.  For further questions, please visit [email or web address]    Post Anesthesia Home Care Instructions  Activity: Get plenty of rest for the remainder of   the day. A responsible individual must stay with you for 24 hours following the procedure.  For the next 24 hours, DO NOT: -Drive a car -Operate machinery -Drink alcoholic beverages -Take any medication unless instructed by your physician -Make any legal decisions or sign important papers.  Meals: Start with liquid foods such as gelatin or soup. Progress to regular foods as tolerated. Avoid greasy, spicy, heavy foods. If nausea and/or vomiting occur, drink only clear liquids until the nausea and/or vomiting subsides. Call your physician if vomiting continues.  Special Instructions/Symptoms: Your throat may feel dry or sore from the anesthesia or the breathing tube placed in your throat during surgery. If this causes discomfort, gargle with warm salt water. The discomfort should disappear within 24 hours.  If you had a scopolamine patch placed behind your ear for the management of post- operative nausea and/or vomiting:  1. The medication in the patch is effective for 72 hours, after which it should be removed.  Wrap patch in a tissue and discard in the trash. Wash hands thoroughly with soap and water. 2. You may remove the patch earlier than 72 hours if you experience unpleasant side effects which may include dry mouth, dizziness or visual disturbances. 3. Avoid touching the patch. Wash your hands with soap and water after contact with the patch.      

## 2022-09-16 NOTE — Anesthesia Preprocedure Evaluation (Addendum)
Anesthesia Evaluation  Patient identified by MRN, date of birth, ID band Patient awake    Reviewed: Allergy & Precautions, NPO status , Patient's Chart, lab work & pertinent test results  Airway Mallampati: II  TM Distance: >3 FB Neck ROM: Full    Dental no notable dental hx. (+) Teeth Intact, Dental Advisory Given   Pulmonary neg pulmonary ROS   Pulmonary exam normal breath sounds clear to auscultation       Cardiovascular negative cardio ROS Normal cardiovascular exam Rhythm:Regular Rate:Normal     Neuro/Psych negative neurological ROS  negative psych ROS   GI/Hepatic negative GI ROS, Neg liver ROS,,,  Endo/Other  negative endocrine ROS    Renal/GU negative Renal ROS  negative genitourinary   Musculoskeletal negative musculoskeletal ROS (+)    Abdominal   Peds  Hematology  (+) Blood dyscrasia, anemia   Anesthesia Other Findings   Reproductive/Obstetrics                             Anesthesia Physical Anesthesia Plan  ASA: 2  Anesthesia Plan: General   Post-op Pain Management: Tylenol PO (pre-op)*   Induction: Intravenous  PONV Risk Score and Plan: 3 and Ondansetron, Dexamethasone and Midazolam  Airway Management Planned: LMA  Additional Equipment:   Intra-op Plan:   Post-operative Plan: Extubation in OR  Informed Consent: I have reviewed the patients History and Physical, chart, labs and discussed the procedure including the risks, benefits and alternatives for the proposed anesthesia with the patient or authorized representative who has indicated his/her understanding and acceptance.     Dental advisory given  Plan Discussed with: CRNA  Anesthesia Plan Comments:        Anesthesia Quick Evaluation

## 2022-09-16 NOTE — Anesthesia Procedure Notes (Signed)
Procedure Name: LMA Insertion Date/Time: 09/16/2022 9:54 AM  Performed by: Bufford Spikes, CRNAPre-anesthesia Checklist: Patient identified, Emergency Drugs available, Suction available and Patient being monitored Patient Re-evaluated:Patient Re-evaluated prior to induction Oxygen Delivery Method: Circle system utilized Preoxygenation: Pre-oxygenation with 100% oxygen Induction Type: IV induction Ventilation: Mask ventilation without difficulty LMA: LMA inserted LMA Size: 4.0 Number of attempts: 1 Airway Equipment and Method: Bite block Placement Confirmation: positive ETCO2 Tube secured with: Tape Dental Injury: Teeth and Oropharynx as per pre-operative assessment

## 2022-09-16 NOTE — Transfer of Care (Signed)
Immediate Anesthesia Transfer of Care Note  Patient: Mercedes Fisher  Procedure(s) Performed: RIGHT BREAST BRACKETED LUMPECTOMY WITH RADIOACTIVE SEED LOCALIZATION (Right: Breast)  Patient Location: PACU  Anesthesia Type:General  Level of Consciousness: awake, alert , and oriented  Airway & Oxygen Therapy: Patient Spontanous Breathing and Patient connected to face mask oxygen  Post-op Assessment: Report given to RN and Post -op Vital signs reviewed and stable  Post vital signs: Reviewed and stable  Last Vitals:  Vitals Value Taken Time  BP 130/74 09/16/22 1124  Temp    Pulse 113 09/16/22 1124  Resp 24 09/16/22 1124  SpO2 99 % 09/16/22 1124  Vitals shown include unvalidated device data.  Last Pain:  Vitals:   09/16/22 0840  TempSrc: Oral  PainSc: 0-No pain         Complications: No notable events documented.

## 2022-09-16 NOTE — Op Note (Signed)
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  Specimen reviewed by radiology.  All seeds were removed.  The area of disease was removed.  Single clip anterior not found but all tissue around this area is widely excised.  This was reviewed with the radiologist.  EBL: 20 cc  Drains: 19 round  IV fluids: Per anesthesia record  Indications for procedure: Patient is a 52 year old female with right breast DCIS.  We discussed options of lumpectomy and breast conserving surgery versus resection with reconstruction.  She opted breast conserving surgery.  Multiple markers were going to be used to help better surround this area to facilitate excision.  We discussed the use of seeds.  We discussed expected postoperative outcome.  We reviewed complications of bleeding, infection, the need for reexcision, the need for additional surgery, pain, cosmesis, need further treatments and/or procedures..The procedure has been discussed with the patient. Alternatives to surgery have been discussed with the patient.  Risks of surgery include bleeding,  Infection,  Seroma formation, death,  and the need for further surgery.   The patient understands and wishes to proceed.      Description of procedure: The patient underwent seed placement for the right breast as an outpatient.  She was seen in the holding area.  Right breast marks correct site and films were reviewed.  All questions were answered.  She was then taken back to the operating room.  After induction of general esthesia, right breast was prepped and draped in a sterile fashion and timeout  performed.  Proper patient, site and procedure verified.  Images were available in the operating room.  Neoprobe was used to identify the seeds.  These were mostly localized in the right inner lower quadrant.  The anterior extent extended up actually just behind the inferior border of the nipple areolar complex.  I marked these with a pen.  Given the wide area of dispersion, I felt to incision to help facilitate better excision.  Local anesthetic was infiltrated along the medial inframammary fold.  Incision was made here.  All tissue with 3 clips in this area were excised with grossly negative margins.  Of note once he came out by itself and this was x-rayed by itself.  I then made a second incision along the inferior border of the nipple-areolar complex.  Local #was used for infiltration for pain control.  All tissue around the area of the seed was excised.  Took additional inferior margins which were 2 from this toward the more lower inner quadrant area of disease.  This ended up being 1 large lumpectomy cavity after all this was removed.  I did not identify the clip in the anterior margin after excision of the inferior margins.  Given the wide nature of excision of the cavity I did not feel any further excision necessary at this point in time and would like to wait till pathology returned.  This large cavity is irrigated.  Given the large cavity nature, I felt a drain would be helpful to ensure the best cosmesis possible.  This was placed through a separate stab incision along the inframammary fold.  The 19 round drain was placed  in the cavity and secured with a single stitch of 2-0 nylon.  The cavity was infiltrated local anesthetic.  It was irrigated.  There is no signs of any ongoing bleeding at this point in time.  We then closed the deeper layers with 3-0 Vicryl.  4 Monocryl was used to close the skin in a subcuticular fashion.  Dermabond was applied.  Bulb placed to suction.  Breast binder placed.  All  counts were found to be correct.  The patient was awoke extubated taken to recovery in satisfactory condition.

## 2022-09-17 ENCOUNTER — Encounter (HOSPITAL_BASED_OUTPATIENT_CLINIC_OR_DEPARTMENT_OTHER): Payer: Self-pay | Admitting: Surgery

## 2022-09-17 NOTE — Anesthesia Postprocedure Evaluation (Addendum)
Anesthesia Post Note  Patient: Primary school teacher  Procedure(s) Performed: RIGHT BREAST BRACKETED LUMPECTOMY WITH RADIOACTIVE SEED LOCALIZATION (Right: Breast)     Patient location during evaluation: PACU Anesthesia Type: General Level of consciousness: awake and alert Pain management: pain level controlled Vital Signs Assessment: post-procedure vital signs reviewed and stable Respiratory status: spontaneous breathing, nonlabored ventilation, respiratory function stable and patient connected to nasal cannula oxygen Cardiovascular status: blood pressure returned to baseline and stable Postop Assessment: no apparent nausea or vomiting Anesthetic complications: no  No notable events documented.  Last Vitals:  Vitals:   09/16/22 1154 09/16/22 1205  BP: (!) 153/89 (!) 154/86  Pulse: (!) 110   Resp: 11 14  Temp: (!) 36.2 C 36.8 C  SpO2: 99% 100%    Last Pain:  Vitals:   09/16/22 1225  TempSrc:   PainSc: 4                  Yarah Fuente L Konor Noren

## 2022-09-22 ENCOUNTER — Inpatient Hospital Stay: Payer: 59 | Attending: Hematology and Oncology | Admitting: Licensed Clinical Social Worker

## 2022-09-22 NOTE — Progress Notes (Signed)
[facility] CSW Counseling Note  Patient was referred by self. Treatment type: Individual - by phone  Presenting Concerns: Patient and/or family reports the following symptoms/concerns: stress Duration of problem: a few weeks; Severity of problem: mild   Orientation:oriented to person, place, time/date, and situation.   Affect: Appropriate and Congruent Risk of harm to self or others: No plan to harm self or others  Patient and/or Family's Strengths/Protective Factors: Social connections, Social and Emotional competence, and Concrete supports in place (healthy food, safe environments, etc.)Ability for insight  Capable of independent living  Communication skills  Motivation for treatment/growth      Goals Addressed: Patient will:  Reduce symptoms of: stress Increase healthy adjustment to current life circumstances   Progress towards Goals: Progressing   Interventions: Interventions utilized:  Strength-based and Supportive      Assessment: Patient had surgery 09/16/22 and is recovering. Processed physical recovery so far and difficulty adjusting daily movements to account for drain. Pt is thankful that the scar is not as bad as she imagined. Pt continues to utilize her meditation and journaling. She is reframing needing to accept help by being thankful for the support in her life. Overall, is adjusting well.       Plan: Follow up with CSW: 3 weeks- brief check-in during [facility] Behavioral recommendations: continue positive coping skills Referral(s): Support group(s):  breast group , tai chi, yoga, peer mentor       Mercedes Douglas, LCSW

## 2022-09-29 ENCOUNTER — Ambulatory Visit: Payer: Self-pay | Admitting: Surgery

## 2022-09-30 ENCOUNTER — Encounter: Payer: Self-pay | Admitting: *Deleted

## 2022-09-30 ENCOUNTER — Ambulatory Visit: Payer: Self-pay | Admitting: Surgery

## 2022-10-06 ENCOUNTER — Encounter (HOSPITAL_COMMUNITY): Payer: Self-pay | Admitting: Surgery

## 2022-10-06 ENCOUNTER — Other Ambulatory Visit: Payer: Self-pay

## 2022-10-06 NOTE — Anesthesia Preprocedure Evaluation (Signed)
Anesthesia Evaluation  Patient identified by MRN, date of birth, ID band Patient awake    Reviewed: Allergy & Precautions, NPO status , Patient's Chart, lab work & pertinent test results  Airway Mallampati: II  TM Distance: >3 FB Neck ROM: Full    Dental no notable dental hx.    Pulmonary neg pulmonary ROS   Pulmonary exam normal breath sounds clear to auscultation       Cardiovascular negative cardio ROS Normal cardiovascular exam Rhythm:Regular Rate:Normal     Neuro/Psych negative neurological ROS  negative psych ROS   GI/Hepatic negative GI ROS, Neg liver ROS,,,  Endo/Other  negative endocrine ROS    Renal/GU negative Renal ROS     Musculoskeletal negative musculoskeletal ROS (+)    Abdominal  (+) + obese  Peds  Hematology  (+) Blood dyscrasia, anemia   Anesthesia Other Findings   Reproductive/Obstetrics                             Anesthesia Physical Anesthesia Plan  ASA: 2  Anesthesia Plan: General   Post-op Pain Management: Tylenol PO (pre-op)* and Regional block*   Induction: Intravenous  PONV Risk Score and Plan: 3 and Ondansetron, Dexamethasone, Midazolam and Treatment may vary due to age or medical condition  Airway Management Planned: LMA  Additional Equipment:   Intra-op Plan:   Post-operative Plan: Extubation in OR  Informed Consent: I have reviewed the patients History and Physical, chart, labs and discussed the procedure including the risks, benefits and alternatives for the proposed anesthesia with the patient or authorized representative who has indicated his/her understanding and acceptance.     Dental advisory given  Plan Discussed with: CRNA  Anesthesia Plan Comments:        Anesthesia Quick Evaluation

## 2022-10-06 NOTE — Pre-Procedure Instructions (Signed)
SDW CALL  Patient was given pre-op instructions over the phone. The opportunity was given for the patient to ask questions. No further questions asked. Patient verbalized understanding of instructions given.   PCP - Berkley Harvey, NP  Cardiologist - denies  PPM/ICD - denies   Chest x-ray - denies EKG - denies Stress Test - denies ECHO - denies Cardiac Cath - denies  Sleep Study - denies  Fasting Blood Sugar - N/A   Blood Thinner Instructions: denies Aspirin Instructions:denies  ERAS Protcol -ERAS per order   COVID TEST- N/A   Anesthesia review:   Patient denies shortness of breath, fever, cough and chest pain over the phone call    Surgical Instructions    Your procedure is scheduled on Wednesday January 24  Report to [facility] Main Entrance "A" at 5:30 A.M., then check in with the Admitting office.  Call this number if you have problems the morning of surgery:  [phone]    Remember:  Do not eat after midnight the night before your surgery  You may drink clear liquids until 4:30AM the morning of your surgery.   Clear liquids allowed are: Water, Non-Citrus Juices (without pulp), Carbonated Beverages, Clear Tea, Black Coffee ONLY (NO MILK, CREAM OR POWDERED CREAMER of any kind), and Gatorade   Take these medicines the morning of surgery with A SIP OF WATER: tylenol if needed    As of today, STOP taking any Aspirin (unless otherwise instructed by your surgeon) Aleve, Naproxen, Ibuprofen, Motrin, Advil, Goody's, BC's, all herbal medications, fish oil, and all vitamins.  [place] [facility] is not responsible for any belongings or valuables.    Contacts, glasses, hearing aids, dentures or partials may not be worn into surgery, please bring cases for these belongings   Patients discharged the day of surgery will not be allowed to drive home, and someone needs to stay with them for 24 hours.   SURGICAL WAITING ROOM VISITATION You may have 1 visitor in the  pre-op area at a time determined by the pre-op nurse. (Visitor may not switch out) Patients having surgery or a procedure in a hospital may have two support people in the waiting room.   Please refer to the [facility] website for the visitor guidelines for Inpatients (after your surgery is over and you are in a regular room).     Special instructions:    Oral Hygiene is also important to reduce your risk of infection.  Remember - BRUSH YOUR TEETH THE MORNING OF SURGERY WITH YOUR REGULAR TOOTHPASTE   Day of Surgery:  Take a shower the day of or night before with antibacterial soap. Wear Clean/Comfortable clothing the morning of surgery Do not apply any deodorants/lotions.   Do not wear jewelry or makeup Do not wear lotions, powders, perfumes/colognes, or deodorant. Do not shave 48 hours prior to surgery.  Men may shave face and neck. Do not bring valuables to the hospital. Do not wear nail polish, gel polish, artificial nails, or any other type of covering on natural nails (fingers and toes)

## 2022-10-07 ENCOUNTER — Ambulatory Visit (HOSPITAL_COMMUNITY): Payer: BC Managed Care – PPO | Admitting: Anesthesiology

## 2022-10-07 ENCOUNTER — Encounter (HOSPITAL_COMMUNITY)
Admission: RE | Disposition: A | Discharge status: Home / Self Care | Payer: Self-pay | Source: Home / Self Care | Attending: Surgery

## 2022-10-07 ENCOUNTER — Ambulatory Visit (HOSPITAL_COMMUNITY)
Admission: RE | Admit: 2022-10-07 | Discharge: 2022-10-07 | Disposition: A | Discharge status: Home / Self Care | Payer: BC Managed Care – PPO | Attending: Surgery | Admitting: Surgery

## 2022-10-07 ENCOUNTER — Encounter (HOSPITAL_COMMUNITY): Payer: Self-pay | Admitting: Surgery

## 2022-10-07 DIAGNOSIS — C50311 Malignant neoplasm of lower-inner quadrant of right female breast: Secondary | ICD-10-CM | POA: Insufficient documentation

## 2022-10-07 DIAGNOSIS — D241 Benign neoplasm of right breast: Secondary | ICD-10-CM | POA: Diagnosis not present

## 2022-10-07 HISTORY — PX: SENTINEL NODE BIOPSY: SHX6608

## 2022-10-07 HISTORY — PX: RE-EXCISION OF BREAST LUMPECTOMY: SHX6048

## 2022-10-07 HISTORY — DX: Family history of other specified conditions: Z84.89

## 2022-10-07 HISTORY — DX: Malignant (primary) neoplasm, unspecified: C80.1

## 2022-10-07 LAB — CBC
HCT: 42 % (ref 36.0–46.0)
Hemoglobin: 14.2 g/dL (ref 12.0–15.0)
MCH: 29.8 pg (ref 26.0–34.0)
MCHC: 33.8 g/dL (ref 30.0–36.0)
MCV: 88.2 fL (ref 80.0–100.0)
Platelets: 305 10*3/uL (ref 150–400)
RBC: 4.76 MIL/uL (ref 3.87–5.11)
RDW: 12.5 % (ref 11.5–15.5)
WBC: 5.5 10*3/uL (ref 4.0–10.5)
nRBC: 0 % (ref 0.0–0.2)

## 2022-10-07 SURGERY — EXCISION, LESION, BREAST
Anesthesia: General | Site: Breast | Laterality: Right

## 2022-10-07 MED ORDER — CHLORHEXIDINE GLUCONATE 0.12 % MT SOLN: 15.0000 mL | Freq: Once | OROMUCOSAL | Status: AC

## 2022-10-07 MED ORDER — PROPOFOL 10 MG/ML IV BOLUS
INTRAVENOUS | Status: AC
  Filled 2022-10-07: qty 20

## 2022-10-07 MED ORDER — PHENYLEPHRINE 80 MCG/ML (10ML) SYRINGE FOR IV PUSH (FOR BLOOD PRESSURE SUPPORT)
PREFILLED_SYRINGE | INTRAVENOUS | Status: DC | PRN
  Administered 2022-10-07: 80 ug via INTRAVENOUS
  Administered 2022-10-07: 40 ug via INTRAVENOUS
  Administered 2022-10-07 (×3): 80 ug via INTRAVENOUS

## 2022-10-07 MED ORDER — PROPOFOL 10 MG/ML IV BOLUS
INTRAVENOUS | Status: DC | PRN
  Administered 2022-10-07: 200 mg via INTRAVENOUS
  Administered 2022-10-07: 50 mg via INTRAVENOUS
  Administered 2022-10-07 (×2): 20 mg via INTRAVENOUS

## 2022-10-07 MED ORDER — MIDAZOLAM HCL 2 MG/2ML IJ SOLN
INTRAMUSCULAR | Status: AC
  Filled 2022-10-07: qty 2

## 2022-10-07 MED ORDER — OXYCODONE HCL 5 MG PO TABS
5.0000 mg | ORAL_TABLET | Freq: Once | ORAL | Status: AC
  Administered 2022-10-07: 5 mg via ORAL

## 2022-10-07 MED ORDER — FENTANYL CITRATE (PF) 100 MCG/2ML IJ SOLN
INTRAMUSCULAR | Status: AC
  Filled 2022-10-07: qty 2

## 2022-10-07 MED ORDER — CHLORHEXIDINE GLUCONATE CLOTH 2 % EX PADS: 6.0000 | MEDICATED_PAD | Freq: Once | CUTANEOUS | Status: DC

## 2022-10-07 MED ORDER — SCOPOLAMINE 1 MG/3DAYS TD PT72
MEDICATED_PATCH | TRANSDERMAL | Status: AC
  Administered 2022-10-07: 1.5 mg via TRANSDERMAL
  Filled 2022-10-07: qty 1

## 2022-10-07 MED ORDER — ONDANSETRON HCL 4 MG/2ML IJ SOLN
INTRAMUSCULAR | Status: DC | PRN
  Administered 2022-10-07: 4 mg via INTRAVENOUS

## 2022-10-07 MED ORDER — LACTATED RINGERS IV SOLN: INTRAVENOUS | Status: DC

## 2022-10-07 MED ORDER — PROMETHAZINE HCL 25 MG/ML IJ SOLN: 6.2500 mg | INTRAMUSCULAR | Status: DC | PRN

## 2022-10-07 MED ORDER — CEFAZOLIN IN SODIUM CHLORIDE 3-0.9 GM/100ML-% IV SOLN
3.0000 g | INTRAVENOUS | Status: AC
  Administered 2022-10-07: 2 g via INTRAVENOUS

## 2022-10-07 MED ORDER — HYDROMORPHONE HCL 1 MG/ML IJ SOLN: 0.2500 mg | INTRAMUSCULAR | Status: DC | PRN

## 2022-10-07 MED ORDER — FENTANYL CITRATE (PF) 250 MCG/5ML IJ SOLN
INTRAMUSCULAR | Status: DC | PRN
  Administered 2022-10-07: 50 ug via INTRAVENOUS
  Administered 2022-10-07: 100 ug via INTRAVENOUS
  Administered 2022-10-07 (×2): 50 ug via INTRAVENOUS

## 2022-10-07 MED ORDER — LIDOCAINE 2% (20 MG/ML) 5 ML SYRINGE
INTRAMUSCULAR | Status: DC | PRN
  Administered 2022-10-07: 100 mg via INTRAVENOUS

## 2022-10-07 MED ORDER — ACETAMINOPHEN 500 MG PO TABS
ORAL_TABLET | ORAL | Status: AC
  Administered 2022-10-07: 1000 mg via ORAL
  Filled 2022-10-07: qty 2

## 2022-10-07 MED ORDER — SCOPOLAMINE 1 MG/3DAYS TD PT72: 1.0000 | MEDICATED_PATCH | TRANSDERMAL | Status: DC

## 2022-10-07 MED ORDER — BUPIVACAINE-EPINEPHRINE 0.5% -1:200000 IJ SOLN
INTRAMUSCULAR | Status: DC | PRN
  Administered 2022-10-07: 20 mL

## 2022-10-07 MED ORDER — IBUPROFEN 800 MG PO TABS: 800.0000 mg | ORAL_TABLET | Freq: Three times a day (TID) | ORAL | 0 refills | Status: AC | PRN

## 2022-10-07 MED ORDER — ACETAMINOPHEN 500 MG PO TABS: 1000.0000 mg | ORAL_TABLET | Freq: Once | ORAL | Status: AC

## 2022-10-07 MED ORDER — DEXAMETHASONE SODIUM PHOSPHATE 10 MG/ML IJ SOLN
INTRAMUSCULAR | Status: DC | PRN
  Administered 2022-10-07: 10 mg via INTRAVENOUS

## 2022-10-07 MED ORDER — AMISULPRIDE (ANTIEMETIC) 5 MG/2ML IV SOLN: 10.0000 mg | Freq: Once | INTRAVENOUS | Status: DC | PRN

## 2022-10-07 MED ORDER — BUPIVACAINE-EPINEPHRINE (PF) 0.5% -1:200000 IJ SOLN
INTRAMUSCULAR | Status: AC
  Filled 2022-10-07: qty 30

## 2022-10-07 MED ORDER — FENTANYL CITRATE (PF) 250 MCG/5ML IJ SOLN
INTRAMUSCULAR | Status: AC
  Filled 2022-10-07: qty 5

## 2022-10-07 MED ORDER — CEFAZOLIN SODIUM-DEXTROSE 2-4 GM/100ML-% IV SOLN
INTRAVENOUS | Status: AC
  Filled 2022-10-07: qty 100

## 2022-10-07 MED ORDER — OXYCODONE HCL 5 MG PO TABS
ORAL_TABLET | ORAL | Status: AC
  Filled 2022-10-07: qty 1

## 2022-10-07 MED ORDER — CHLORHEXIDINE GLUCONATE 0.12 % MT SOLN
OROMUCOSAL | Status: AC
  Administered 2022-10-07: 15 mL via OROMUCOSAL
  Filled 2022-10-07: qty 15

## 2022-10-07 MED ORDER — OXYCODONE HCL 5 MG PO TABS: 5.0000 mg | ORAL_TABLET | Freq: Four times a day (QID) | ORAL | 0 refills | Status: DC | PRN

## 2022-10-07 MED ORDER — ORAL CARE MOUTH RINSE: 15.0000 mL | Freq: Once | OROMUCOSAL | Status: AC

## 2022-10-07 MED ORDER — MAGTRACE LYMPHATIC TRACER
INTRAMUSCULAR | Status: DC | PRN
  Administered 2022-10-07: 2 mL via INTRAMUSCULAR

## 2022-10-07 MED ORDER — MEPERIDINE HCL 25 MG/ML IJ SOLN: 6.2500 mg | INTRAMUSCULAR | Status: DC | PRN

## 2022-10-07 SURGICAL SUPPLY — 39 items
APPLIER CLIP 9.375 MED OPEN (MISCELLANEOUS) ×2
BAG COUNTER SPONGE SURGICOUNT (BAG) IMPLANT
CANISTER SUCT 3000ML PPV (MISCELLANEOUS) IMPLANT
CHLORAPREP W/TINT 26 (MISCELLANEOUS) ×2 IMPLANT
CLIP APPLIE 9.375 MED OPEN (MISCELLANEOUS) ×2 IMPLANT
CNTNR URN SCR LID CUP LEK RST (MISCELLANEOUS) ×2 IMPLANT
CONT SPEC 4OZ STRL OR WHT (MISCELLANEOUS) ×2
COVER SURGICAL LIGHT HANDLE (MISCELLANEOUS) ×2 IMPLANT
DERMABOND ADVANCED .7 DNX12 (GAUZE/BANDAGES/DRESSINGS) ×2 IMPLANT
DERMABOND ADVANCED .7 DNX6 (GAUZE/BANDAGES/DRESSINGS) IMPLANT
DRAPE CHEST BREAST 15X10 FENES (DRAPES) ×2 IMPLANT
ELECT REM PT RETURN 9FT ADLT (ELECTROSURGICAL) ×2
ELECTRODE REM PT RTRN 9FT ADLT (ELECTROSURGICAL) ×2 IMPLANT
GAUZE SPONGE 4X4 12PLY STRL (GAUZE/BANDAGES/DRESSINGS) ×2 IMPLANT
GLOVE BIO SURGEON STRL SZ8 (GLOVE) ×2 IMPLANT
GLOVE BIOGEL PI IND STRL 8 (GLOVE) ×2 IMPLANT
GOWN STRL REUS W/ TWL LRG LVL3 (GOWN DISPOSABLE) ×4 IMPLANT
GOWN STRL REUS W/ TWL XL LVL3 (GOWN DISPOSABLE) ×2 IMPLANT
GOWN STRL REUS W/TWL LRG LVL3 (GOWN DISPOSABLE) ×4
GOWN STRL REUS W/TWL XL LVL3 (GOWN DISPOSABLE) ×2
HEMOSTAT ARISTA ABSORB 3G PWDR (HEMOSTASIS) IMPLANT
KIT BASIN OR (CUSTOM PROCEDURE TRAY) ×2 IMPLANT
KIT MARKER MARGIN INK (KITS) ×2 IMPLANT
KIT TURNOVER KIT B (KITS) ×2 IMPLANT
LIGHT WAVEGUIDE WIDE FLAT (MISCELLANEOUS) ×2 IMPLANT
NDL HYPO 25GX1X1/2 BEV (NEEDLE) ×2 IMPLANT
NEEDLE HYPO 25GX1X1/2 BEV (NEEDLE) ×2 IMPLANT
NS IRRIG 1000ML POUR BTL (IV SOLUTION) ×2 IMPLANT
PACK GENERAL/GYN (CUSTOM PROCEDURE TRAY) ×2 IMPLANT
PAD ARMBOARD 7.5X6 YLW CONV (MISCELLANEOUS) ×2 IMPLANT
PENCIL SMOKE EVACUATOR (MISCELLANEOUS) ×2 IMPLANT
SPIKE FLUID TRANSFER (MISCELLANEOUS) ×2 IMPLANT
SPONGE T-LAP 4X18 ~~LOC~~+RFID (SPONGE) IMPLANT
SUT MNCRL AB 4-0 PS2 18 (SUTURE) ×2 IMPLANT
SUT VIC AB 3-0 SH 18 (SUTURE) ×2 IMPLANT
SUT VIC AB 3-0 SH 8-18 (SUTURE) IMPLANT
SYR CONTROL 10ML LL (SYRINGE) ×2 IMPLANT
TOWEL GREEN STERILE (TOWEL DISPOSABLE) ×2 IMPLANT
TOWEL GREEN STERILE FF (TOWEL DISPOSABLE) ×2 IMPLANT

## 2022-10-07 NOTE — Discharge Instructions (Signed)
[facility] °Office Phone Number [phone] ° °BREAST BIOPSY/ PARTIAL MASTECTOMY: POST OP INSTRUCTIONS ° °Always review your discharge instruction sheet given to you by the facility where your surgery was performed. ° °IF YOU HAVE DISABILITY OR FAMILY LEAVE FORMS, YOU MUST BRING THEM TO THE OFFICE FOR PROCESSING.  DO NOT GIVE THEM TO YOUR DOCTOR. ° °A prescription for pain medication may be given to you upon discharge.  Take your pain medication as prescribed, if needed.  If narcotic pain medicine is not needed, then you may take acetaminophen (Tylenol) or ibuprofen (Advil) as needed. °Take your usually prescribed medications unless otherwise directed °If you need a refill on your pain medication, please contact your pharmacy.  They will contact our office to request authorization.  Prescriptions will not be filled after 5pm or on week-ends. °You should eat very light the first 24 hours after surgery, such as soup, crackers, pudding, etc.  Resume your normal diet the day after surgery. °Most patients will experience some swelling and bruising in the breast.  Ice packs and a good support bra will help.  Swelling and bruising can take several days to resolve.  °It is common to experience some constipation if taking pain medication after surgery.  Increasing fluid intake and taking a stool softener will usually help or prevent this problem from occurring.  A mild laxative (Milk of Magnesia or Miralax) should be taken according to package directions if there are no bowel movements after 48 hours. °Unless discharge instructions indicate otherwise, you may remove your bandages 24-48 hours after surgery, and you may shower at that time.  You may have steri-strips (small skin tapes) in place directly over the incision.  These strips should be left on the skin for 7-10 days.  If your surgeon used skin glue on the incision, you may shower in 24 hours.  The glue will flake off over the next 2-3 weeks.  Any  sutures or staples will be removed at the office during your follow-up visit. °ACTIVITIES:  You may resume regular daily activities (gradually increasing) beginning the next day.  Wearing a good support bra or sports bra minimizes pain and swelling.  You may have sexual intercourse when it is comfortable. °You may drive when you no longer are taking prescription pain medication, you can comfortably wear a seatbelt, and you can safely maneuver your car and apply brakes. °RETURN TO WORK:  ______________________________________________________________________________________ °You should see your doctor in the office for a follow-up appointment approximately two weeks after your surgery.  Your doctor’s nurse will typically make your follow-up appointment when she calls you with your pathology report.  Expect your pathology report 2-3 business days after your surgery.  You may call to check if you do not hear from us after three days. °OTHER INSTRUCTIONS: _______________________________________________________________________________________________ _____________________________________________________________________________________________________________________________________ °_____________________________________________________________________________________________________________________________________ °_____________________________________________________________________________________________________________________________________ ° °WHEN TO CALL YOUR DOCTOR: °Fever over 101.0 °Nausea and/or vomiting. °Extreme swelling or bruising. °Continued bleeding from incision. °Increased pain, redness, or drainage from the incision. ° °The clinic staff is available to answer your questions during regular business hours.  Please don’t hesitate to call and ask to speak to one of the nurses for clinical concerns.  If you have a medical emergency, go to the nearest emergency room or call 911.  A surgeon from [facility] is always on call at the hospital. ° °For further questions, please visit [email or web address]  ° °

## 2022-10-07 NOTE — Anesthesia Procedure Notes (Signed)
Procedure Name: LMA Insertion Date/Time: 10/07/2022 8:17 AM  Performed by: Mariea Clonts, CRNAPre-anesthesia Checklist: Patient identified, Emergency Drugs available, Suction available and Patient being monitored Patient Re-evaluated:Patient Re-evaluated prior to induction Oxygen Delivery Method: Circle System Utilized Preoxygenation: Pre-oxygenation with 100% oxygen Induction Type: IV induction Ventilation: Mask ventilation without difficulty LMA: LMA inserted LMA Size: 4.0 Number of attempts: 1 Airway Equipment and Method: Bite block Placement Confirmation: positive ETCO2 Tube secured with: Tape Dental Injury: Teeth and Oropharynx as per pre-operative assessment

## 2022-10-07 NOTE — Interval H&P Note (Signed)
History and Physical Interval Note:  10/07/2022 7:13 AM  Mercedes Fisher  has presented today for surgery, with the diagnosis of RIGHT BREAST CANCER.  The various methods of treatment have been discussed with the patient and family. After consideration of risks, benefits and other options for treatment, the patient has consented to  Procedure(s): RE-EXCISION OF RIGHT BREAST LUMPECTOMY (Right) RIGHT SENTINEL NODE BIOPSY (Right) as a surgical intervention.  The patient's history has been reviewed, patient examined, no change in status, stable for surgery.  I have reviewed the patient's chart and labs.  Questions were answered to the patient's satisfaction.     [place]

## 2022-10-07 NOTE — Transfer of Care (Signed)
Immediate Anesthesia Transfer of Care Note  Patient: Luv Mcvicar  Procedure(s) Performed: RE-EXCISION OF RIGHT BREAST LUMPECTOMY (Right: Breast) RIGHT SENTINEL NODE BIOPSY (Right)  Patient Location: PACU  Anesthesia Type:GA combined with regional for post-op pain  Level of Consciousness: awake, alert , and oriented  Airway & Oxygen Therapy: Patient Spontanous Breathing and Patient connected to nasal cannula oxygen  Post-op Assessment: Report given to RN, Post -op Vital signs reviewed and stable, and Patient moving all extremities X 4  Post vital signs: Reviewed and stable  Last Vitals:  Vitals Value Taken Time  BP 137/82 10/07/22 0938  Temp    Pulse 101 10/07/22 0942  Resp 20 10/07/22 0942  SpO2 100 % 10/07/22 0942  Vitals shown include unvalidated device data.  Last Pain:  Vitals:   10/07/22 0608  PainSc: 0-No pain         Complications: No notable events documented.

## 2022-10-07 NOTE — Anesthesia Postprocedure Evaluation (Signed)
Anesthesia Post Note  Patient: Primary school teacher  Procedure(s) Performed: RE-EXCISION OF RIGHT BREAST LUMPECTOMY (Right: Breast) RIGHT SENTINEL NODE BIOPSY (Right)     Patient location during evaluation: PACU Anesthesia Type: General Level of consciousness: sedated and patient cooperative Pain management: pain level controlled Vital Signs Assessment: post-procedure vital signs reviewed and stable Respiratory status: spontaneous breathing Cardiovascular status: stable Anesthetic complications: no   No notable events documented.  Last Vitals:  Vitals:   10/07/22 0543 10/07/22 0939  BP: (!) 147/92   Pulse: (!) 115 (!) 121  Temp: 36.9 C 37 C  SpO2: 98%     Last Pain:  Vitals:   10/07/22 4171  PainSc: 0-No pain                 Nolon Nations

## 2022-10-07 NOTE — Op Note (Signed)
Preoperative diagnosis: Stage I right breast cancer lower inner quadrant with DCIS  Postoperative diagnosis: Same  Procedure: Right breast reexcision lumpectomy with right axillary sentinel lymph node mapping using MAC trace  Surgeon: Erroll Luna, MD  Anesthesia: LMA with 0.5% Marcaine plain  EBL: Minimal  Specimen: Margins from right breast lumpectomy and 3 right axillary sentinel nodes  Drains: None  Indications for procedure: The patient is a 52 year old female who underwent a lumpectomy on the right for DCIS.  Microinvasion was found and she returns for right axillary sentinel lymph node mapping.  The pros and cons of the procedure reviewed as well as long-term data.  Local regional recurrence, survival and quality life were reviewed.  We reviewed the issues with lymphedema, nerve injury, blood vessel injury, shoulder stiffness after the procedure.  Admission was also reviewed.  After discussing all the above to review the case, she wished to proceed with reexcision as well as sentinel lymph node mapping.  Her initial lumpectomy the margins were clear but there was some confusion and orientation.  I felt given the large area of the reexcision is reasonable especially going back for a sentinel node.The procedure has been discussed with the patient. Alternatives to surgery have been discussed with the patient.  Risks of surgery include bleeding,  Infection,  Seroma formation, death,  and the need for further surgery.   The patient understands and wishes to proceed. Sentinel lymph node mapping and dissection has been discussed with the patient.  Risk of bleeding,  Infection,  Seroma formation,  Additional procedures,,  Shoulder weakness ,  Shoulder stiffness,  Nerve and blood vessel injury and reaction to the mapping dyes have been discussed.  Alternatives to surgery have been discussed with the patient.  The patient agrees to proceed.      Description of procedure: The patient was met in  the holding area and questions were answered.  The right breast was marked as the correct site.  She was then taken back to the operative room.  She was placed supine upon the OR.  After induction of general anesthesia, right breast was prepped and draped in sterile fashion and timeout performed.  2 cc of MAC tracer injected into the right breast in the subcutaneous tissues.  This was massaged for 5 minutes.  Patient had 2 incisions for lumpectomy 1 just below the nipple areolar complex and the other along the inframammary fold.  These were connected to 1 cavity.  I went and opened the incision along the inferior fold.  The seroma was evacuated.  I then shaved all margins with pathology.  There is excellent hemostasis.  This was irrigated.  Clips were already in place.  I then closed the deep tissue layers with 3-0 Vicryl.  4 Monocryl was used to close the skin in a subcuticular fashion.  The mag trace probe was used.  A area of increased activity was identified.  A incision was made in the inferior border of the axillary hairline.  Dissection was carried down into the level 1 contents.  There were 3 nodes that picked up the tracer.  These were removed.  Background counts approached baseline.  Hemostasis achieved with cautery and Arista.  Long thoracic nerve, thoracodorsal trunk and axillary vein were preserved.  Anesthetic infiltrated.  Deep tissue layers closed with 3-0 Vicryl.  4 Monocryl used to close the skin in a subcuticular fashion.  All counts were found to be correct..  Breast binder placed.  Patient was then awoke  extubated taken to recovery in satisfactory condition.

## 2022-10-07 NOTE — H&P (Signed)
Mercedes Fisher is an 52 y.o. female.   Chief Complaint: breast cancer right  HPI: pt presents for re excision right lumpectomy and SLN mapping   Past Medical History:  Diagnosis Date   Anemia    took Iron supplements    Cancer ([place])    breast cancer   Family history of adverse reaction to anesthesia    mom hard to wake up after anesthesia    Past Surgical History:  Procedure Laterality Date   BREAST BIOPSY  09/15/2022   MM RT RADIOACTIVE SEED LOC MAMMO GUIDE 09/15/2022 [facility]   BREAST BIOPSY  09/15/2022   MM RT RADIOACTIVE SEED EA ADD LESION LOC MAMMO GUIDE 09/15/2022 [facility]   BREAST BIOPSY  09/15/2022   MM RT RADIOACTIVE SEED EA ADD LESION LOC MAMMO GUIDE 09/15/2022 [facility]   BREAST BIOPSY  09/15/2022   MM RT RADIOACTIVE SEED EA ADD LESION LOC MAMMO GUIDE 09/15/2022 [facility]   BREAST LUMPECTOMY WITH RADIOACTIVE SEED LOCALIZATION Right 09/16/2022   Procedure: RIGHT BREAST BRACKETED LUMPECTOMY WITH RADIOACTIVE SEED LOCALIZATION;  Surgeon: Erroll Luna, MD;  Location: [facility];  Service: General;  Laterality: Right;   LAPAROSCOPIC BILATERAL SALPINGECTOMY Bilateral 06/14/2015   Procedure: LAPAROSCOPIC BILATERAL SALPINGECTOMY;  Surgeon: Servando Salina, MD;  Location: [facility];  Service: Gynecology;  Laterality: Bilateral;   ROBOTIC ASSISTED TOTAL HYSTERECTOMY N/A 06/14/2015   Procedure: ROBOTIC ASSISTED TOTAL HYSTERECTOMY With Morcellation;  Surgeon: Servando Salina, MD;  Location: [facility];  Service: Gynecology;  Laterality: N/A;    Family History  Problem Relation Age of Onset   Pancreatic cancer Paternal Aunt    Breast cancer Paternal Aunt        dx 83s   Breast cancer Paternal Grandmother        dx 21s   Social History:  reports that she has never smoked. She has never used smokeless tobacco. She reports that she does not drink alcohol and does not use drugs.  Allergies:  Allergies  Allergen Reactions   Chlorhexidine Rash    Latex Rash    Medications Prior to Admission  Medication Sig Dispense Refill   acetaminophen (TYLENOL) 500 MG tablet Take 500 mg by mouth every 6 (six) hours as needed for mild pain.     Multiple Vitamin (MULTIVITAMIN WITH MINERALS) TABS tablet Take 1 tablet by mouth daily. (Patient not taking: Reported on 10/02/2022)     oxyCODONE (OXY IR/ROXICODONE) 5 MG immediate release tablet Take 1 tablet (5 mg total) by mouth every 6 (six) hours as needed for severe pain. (Patient not taking: Reported on 10/02/2022) 15 tablet 0    Results for orders placed or performed during the hospital encounter of 10/07/22 (from the past 48 hour(s))  CBC per protocol     Status: None   Collection Time: 10/07/22  6:19 AM  Result Value Ref Range   WBC 5.5 4.0 - 10.5 K/uL   RBC 4.76 3.87 - 5.11 MIL/uL   Hemoglobin 14.2 12.0 - 15.0 g/dL   HCT 42.0 36.0 - 46.0 %   MCV 88.2 80.0 - 100.0 fL   MCH 29.8 26.0 - 34.0 pg   MCHC 33.8 30.0 - 36.0 g/dL   RDW 12.5 11.5 - 15.5 %   Platelets 305 150 - 400 K/uL   nRBC 0.0 0.0 - 0.2 %    Comment: Performed at [facility], [place] [address], [place], [place] [address]   No results found.  Review of Systems  All  other systems reviewed and are negative.   Blood pressure (!) 147/92, pulse (!) 115, temperature 98.4 F (36.9 C), height '5\' 7"'$  (1.702 m), weight 100.2 kg, SpO2 98 %. Physical Exam Cardiovascular:     Rate and Rhythm: Normal rate.  Pulmonary:     Effort: Pulmonary effort is normal.  Chest:       Comments: Incision CDI Neurological:     Mental Status: She is alert.      Assessment/Plan RIGHT BREAST CANCER  RE EXCISION RIGHT BREAST LUMPECTOMY RIGHT SLN MAPPING STAGE 1 BREAST CANCER  The procedure has been discussed with the patient. Alternatives to surgery have been discussed with the patient.  Risks of surgery include bleeding,  Infection,  Seroma formation, death,  and the need for further surgery.   The patient understands and wishes to proceed.  Sentinel lymph node mapping and dissection has been discussed with the patient.  Risk of bleeding,  Infection,  Seroma formation,  Additional procedures,,  Shoulder weakness ,  Shoulder stiffness,  Nerve and blood vessel injury and reaction to the mapping dyes have been discussed.  Alternatives to surgery have been discussed with the patient.  The patient agrees to proceed.   Turner Daniels, MD 10/07/2022, 7:12 AM

## 2022-10-08 ENCOUNTER — Encounter (HOSPITAL_COMMUNITY): Payer: Self-pay | Admitting: Surgery

## 2022-10-12 ENCOUNTER — Encounter: Payer: Self-pay | Admitting: Surgery

## 2022-10-12 LAB — SURGICAL PATHOLOGY

## 2022-10-14 ENCOUNTER — Inpatient Hospital Stay: Payer: 59 | Admitting: Licensed Clinical Social Worker

## 2022-10-14 ENCOUNTER — Ambulatory Visit: Payer: BC Managed Care – PPO | Admitting: Radiation Oncology

## 2022-10-14 ENCOUNTER — Ambulatory Visit: Payer: 59 | Admitting: Radiation Oncology

## 2022-10-14 ENCOUNTER — Ambulatory Visit: Payer: 59

## 2022-10-14 NOTE — Progress Notes (Signed)
[facility] CSW Counseling Note  Patient was referred by self. Treatment type: Individual   Presenting Concerns: Patient and/or family reports the following symptoms/concerns: stress Duration of problem: a few weeks; Severity of problem: mild   Orientation:oriented to person, place, time/date, and situation.   Affect: Appropriate and Congruent Risk of harm to self or others: No plan to harm self or others  Patient and/or Family's Strengths/Protective Factors: Social connections, Social and Emotional competence, and Concrete supports in place (healthy food, safe environments, etc.)Ability for insight  Capable of independent living  Communication skills  Motivation for treatment/growth      Goals Addressed: Patient will:  Reduce symptoms of: stress Increase healthy adjustment to current life circumstances   Progress towards Goals: Progressing   Interventions: Interventions utilized:  Strength-based and Supportive      Assessment: Since last visit, pt has had to have a second surgery due to positive margins. She is still recovering physically and is disappointed that she has to wait more for the next step of radiation. Pt is still engaging in her self-care activities and has strong support from her kids, mom, and friends. Pt shared and processed today concerns related to home and relationship. CSW provided supportive counsel and also discussed resources for financial support.      Plan: Follow up with CSW: 11/04/22 Behavioral recommendations: continue positive coping skills. Work on Location manager for assistance and notify CSW with any questions. Referral(s): Support group(s):  breast group , tai chi, yoga, peer mentor. Signed up for Reiki. Will sign up for Nutrition 101 in March       [place] Trinitee Horgan, LCSW

## 2022-10-15 ENCOUNTER — Telehealth: Payer: Self-pay | Admitting: Hematology and Oncology

## 2022-10-15 NOTE — Telephone Encounter (Signed)
Contacted patient to scheduled appointments. Patient is aware of appointments that are scheduled.   

## 2022-10-21 LAB — SURGICAL PATHOLOGY

## 2022-10-22 ENCOUNTER — Encounter: Payer: Self-pay | Admitting: *Deleted

## 2022-10-22 ENCOUNTER — Telehealth: Payer: Self-pay | Admitting: *Deleted

## 2022-10-22 DIAGNOSIS — C50911 Malignant neoplasm of unspecified site of right female breast: Secondary | ICD-10-CM | POA: Insufficient documentation

## 2022-10-22 NOTE — Telephone Encounter (Signed)
Received order for oncotype testing. Requisition faxed to [facility]

## 2022-10-30 ENCOUNTER — Telehealth: Payer: Self-pay | Admitting: Radiation Oncology

## 2022-10-30 NOTE — Telephone Encounter (Signed)
1/31 pt need more sx; please cx her appts with Dr. Karl Ito -per [facility]

## 2022-11-02 ENCOUNTER — Encounter: Payer: Self-pay | Admitting: *Deleted

## 2022-11-02 ENCOUNTER — Telehealth: Payer: Self-pay | Admitting: *Deleted

## 2022-11-02 NOTE — Telephone Encounter (Signed)
Received oncotype of 29/18%.  Team aware.  Appt to see Dr.Iruku 2/21.

## 2022-11-03 ENCOUNTER — Encounter (HOSPITAL_COMMUNITY): Payer: Self-pay

## 2022-11-04 ENCOUNTER — Inpatient Hospital Stay (HOSPITAL_BASED_OUTPATIENT_CLINIC_OR_DEPARTMENT_OTHER): Payer: BC Managed Care – PPO | Admitting: Hematology and Oncology

## 2022-11-04 ENCOUNTER — Inpatient Hospital Stay: Payer: BC Managed Care – PPO | Attending: Hematology and Oncology | Admitting: Licensed Clinical Social Worker

## 2022-11-04 VITALS — BP 147/80 | HR 118 | Temp 98.1°F | Resp 18 | Ht 67.0 in | Wt 213.6 lb

## 2022-11-04 DIAGNOSIS — D0510 Intraductal carcinoma in situ of unspecified breast: Secondary | ICD-10-CM

## 2022-11-04 DIAGNOSIS — C50311 Malignant neoplasm of lower-inner quadrant of right female breast: Secondary | ICD-10-CM | POA: Diagnosis not present

## 2022-11-04 DIAGNOSIS — D0511 Intraductal carcinoma in situ of right breast: Secondary | ICD-10-CM | POA: Diagnosis present

## 2022-11-04 DIAGNOSIS — C50919 Malignant neoplasm of unspecified site of unspecified female breast: Secondary | ICD-10-CM

## 2022-11-04 DIAGNOSIS — Z170 Estrogen receptor positive status [ER+]: Secondary | ICD-10-CM

## 2022-11-04 NOTE — Progress Notes (Signed)
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  Sentinel lymph nodes negative for malignancy.  Oncotype DX resulted at 29, her risk of recurrence of breast cancer at 9 years is approximately 18%, there is benefit of chemotherapy demonstrated in this group of patients.  I have hence recommended that she consider adjuvant docetaxel and cyclophosphamide every 21 days for 4 cycles for adjuvant therapy.  After chemotherapy she can proceed with adjuvant radiation followed by antiestrogen therapy.  I have given her printed information about the Oncotype DX, docetaxel and cyclophosphamide for her review.  Will call her back on Monday to review her decision if she would like to proceed with adjuvant chemotherapy.  Her mom had some excellent questions today and all their questions were answered to the best my knowledge.  HISTORY OF PRESENTING ILLNESS:  Mercedes Fisher 52 y.o. female is here because of recent diagnosis of right breast IDC and ILC  I reviewed her records extensively and collaborated the history with the patient.  SUMMARY OF ONCOLOGIC HISTORY: Oncology History  DCIS (ductal carcinoma in situ) of breast  07/14/2022 Mammogram   Diagnostic bilateral mammogram done on July 14, 2022 showed a broad area of calcifications in the lower inner quadrant  of the right breast.  Within this area of calcifications that are similar to the surrounding calcifications and warrant biopsy.  No evidence of malignancy in the left breast.   08/18/2022 Initial Diagnosis   DCIS (ductal carcinoma in situ) of breast    Pathology Results   Pathology results showed intermediate nuclear grade DCIS in both sides sites Top-Hat clip and a Q clip.  Prognostic showed ER/PR positivity.    Mercedes Fisher is here for follow-up.  Since her last visit she had margin reexcision which once again shows DCIS involving superior margin, incidental intraductal papilloma, 3 sentinel lymph nodes negative for cancer.  She is healing well.  She refused additional surgery despite positive margins according to Dr. Josetta Huddle note.  No concerning review of systems today.  MEDICAL HISTORY:  Past Medical History:  Diagnosis Date   Anemia    took Iron supplements    Cancer ([place])    breast cancer   Family history of adverse reaction to anesthesia    mom hard to wake up after anesthesia    SURGICAL HISTORY: Past Surgical History:  Procedure Laterality Date   BREAST BIOPSY  09/15/2022   MM RT RADIOACTIVE SEED LOC MAMMO GUIDE 09/15/2022 [facility]   BREAST BIOPSY  09/15/2022   MM RT RADIOACTIVE SEED EA ADD LESION LOC MAMMO GUIDE 09/15/2022 [facility]   BREAST BIOPSY  09/15/2022   MM RT RADIOACTIVE SEED EA ADD LESION LOC MAMMO GUIDE 09/15/2022 [facility]   BREAST BIOPSY  09/15/2022   MM RT RADIOACTIVE SEED EA ADD LESION LOC MAMMO GUIDE 09/15/2022 [facility]   BREAST LUMPECTOMY WITH RADIOACTIVE SEED LOCALIZATION Right 09/16/2022  Procedure: RIGHT BREAST BRACKETED LUMPECTOMY WITH RADIOACTIVE SEED LOCALIZATION;  Surgeon: Erroll Luna, MD;  Location: [facility];  Service: General;  Laterality: Right;   LAPAROSCOPIC BILATERAL SALPINGECTOMY Bilateral 06/14/2015   Procedure: LAPAROSCOPIC BILATERAL SALPINGECTOMY;  Surgeon: Servando Salina, MD;  Location: [facility];  Service: Gynecology;  Laterality: Bilateral;   RE-EXCISION OF BREAST LUMPECTOMY Right 10/07/2022   Procedure: RE-EXCISION OF RIGHT BREAST LUMPECTOMY;  Surgeon: Erroll Luna, MD;  Location: [facility];  Service: General;  Laterality: Right;   ROBOTIC ASSISTED TOTAL HYSTERECTOMY N/A 06/14/2015   Procedure: ROBOTIC ASSISTED TOTAL HYSTERECTOMY With Morcellation;  Surgeon: Servando Salina, MD;  Location: [facility];  Service: Gynecology;  Laterality: N/A;   SENTINEL NODE BIOPSY Right 10/07/2022   Procedure: RIGHT SENTINEL NODE BIOPSY;  Surgeon: Erroll Luna, MD;  Location: [facility];  Service: General;  Laterality: Right;    SOCIAL HISTORY: Social History   Socioeconomic History   Marital status: Married    Spouse name: Not on file   Number of children: Not on file   Years of education: Not on file   Highest education level: Not on file  Occupational History   Not on file  Tobacco Use   Smoking status: Never   Smokeless tobacco: Never  Vaping Use   Vaping Use: Never used  Substance and Sexual Activity   Alcohol use: No   Drug use: No   Sexual activity: Not on file  Other Topics Concern   Not on file  Social History Narrative   Not on file   Social Determinants of Health   Financial Resource Strain: Not on file  Food Insecurity: Not on file  Transportation Needs: No Transportation Needs (08/27/2022)   PRAPARE - Hydrologist (Medical): No    Lack of Transportation (Non-Medical): No  Physical Activity: Not on file  Stress: Not on file  Social Connections: Not on file  Intimate Partner Violence: Not on file    FAMILY HISTORY: Family History  Problem Relation Age of Onset   Pancreatic cancer Paternal Aunt    Breast cancer Paternal Aunt        dx 70s   Breast cancer Paternal Grandmother        dx 32s    ALLERGIES:  is allergic to chlorhexidine and latex.  MEDICATIONS:  Current Outpatient Medications  Medication Sig Dispense Refill    acetaminophen (TYLENOL) 500 MG tablet Take 500 mg by mouth every 6 (six) hours as needed for mild pain.     ibuprofen (ADVIL) 800 MG tablet Take 1 tablet (800 mg total) by mouth every 8 (eight) hours as needed. 30 tablet 0   Multiple Vitamin (MULTIVITAMIN WITH MINERALS) TABS tablet Take 1 tablet by mouth daily. (Patient not taking: Reported on 10/02/2022)     oxyCODONE (OXY IR/ROXICODONE) 5 MG immediate release tablet Take 1 tablet (5 mg total) by mouth every 6 (six) hours as needed for severe pain. 15 tablet 0   No current facility-administered medications for this visit.    REVIEW OF SYSTEMS:   Constitutional: Denies fevers, chills or abnormal night sweats Eyes: Denies blurriness of vision, double vision or watery eyes Ears, nose, mouth, throat, and face: Denies mucositis or sore throat Respiratory: Denies cough, dyspnea or wheezes Cardiovascular: Denies palpitation, chest discomfort or lower extremity swelling Gastrointestinal:  Denies nausea, heartburn or change in bowel habits Skin: Denies abnormal skin rashes Lymphatics: Denies new lymphadenopathy or easy bruising Neurological:Denies numbness, tingling or new weaknesses Behavioral/Psych:  Mood is stable, no new changes  Breast: Denies any palpable lumps or discharge All other systems were reviewed with the patient and are negative.  PHYSICAL EXAMINATION: ECOG PERFORMANCE STATUS: 0 - Asymptomatic  Vitals:   11/04/22 1550  BP: (!) 147/80  Pulse: (!) 118  Resp: 18  Temp: 98.1 F (36.7 C)  SpO2: 100%   Filed Weights   11/04/22 1550  Weight: 213 lb 9.6 oz (96.9 kg)    Physical examination deferred in lieu of counseling LABORATORY DATA:  I have reviewed the data as listed Lab Results  Component Value Date   WBC 5.5 10/07/2022   HGB 14.2 10/07/2022   HCT 42.0 10/07/2022   MCV 88.2 10/07/2022   PLT 305 10/07/2022   Lab Results  Component Value Date   NA 137 06/15/2015   K 3.5 06/15/2015   CL 105 06/15/2015   CO2 25  06/15/2015    RADIOGRAPHIC STUDIES: I have personally reviewed the radiological reports and agreed with the findings in the report.  Time spent 30 minutes including history, physical exam, review of records, counseling and coordination of care and All questions were answered. The patient knows to call the clinic with any problems, questions or concerns.    Benay Pike, MD 11/04/22

## 2022-11-04 NOTE — Progress Notes (Signed)
[facility] CSW Counseling Note  Patient was referred by self. Treatment type: Individual  Pt's daughter accompanied her today  Presenting Concerns: Patient and/or family reports the following symptoms/concerns: stress Duration of problem: a few weeks; Severity of problem: mild   Orientation:oriented to person, place, time/date, and situation.   Affect: Appropriate and Congruent Risk of harm to self or others: No plan to harm self or others  Patient and/or Family's Strengths/Protective Factors: Social connections, Social and Emotional competence, and Concrete supports in place (healthy food, safe environments, etc.)Ability for insight  Capable of independent living  Communication skills  Motivation for treatment/growth      Goals Addressed: Patient will:  Reduce symptoms of: stress Increase healthy adjustment to current life circumstances   Progress towards Goals: Progressing   Interventions: Interventions utilized:  Strength-based and Supportive      Assessment: Patient is coping well since last visit. She did receive news on positive margins after 2nd surgery but has chosen not to go forward with another surgery. She is meeting with Dr. Chryl Heck this afternoon.  Pt has returned to work part-time which has helped her mentally. She has also taken steps to have conversation with her husband and set expectations and boundaries to help improve her stress. Overall, she continues to notice her triggers, allow herself to process feelings, and utilize her coping skills (meditation, journaling, walking daily & talking with daughter) and is doing reiki sessions as well. She has not begun financial applications, but plans to look into them more in the future.     Plan: Follow up with CSW: 3 weeks Behavioral recommendations: continue positive coping skills. Work on Location manager for assistance and notify CSW with any questions. Referral(s): Support group(s):  breast group , tai chi, yoga, peer  mentor, Reiki. Will sign up for Nutrition 101 in March       [place] Nilda Keathley, LCSW

## 2022-11-06 ENCOUNTER — Encounter: Payer: Self-pay | Admitting: *Deleted

## 2022-11-06 ENCOUNTER — Ambulatory Visit: Payer: BC Managed Care – PPO | Admitting: Radiation Oncology

## 2022-11-06 ENCOUNTER — Ambulatory Visit: Payer: BC Managed Care – PPO

## 2022-11-10 ENCOUNTER — Ambulatory Visit: Payer: 59 | Admitting: Hematology and Oncology

## 2022-11-11 ENCOUNTER — Telehealth: Payer: Self-pay | Admitting: Hematology and Oncology

## 2022-11-11 ENCOUNTER — Encounter: Payer: Self-pay | Admitting: Hematology and Oncology

## 2022-11-11 ENCOUNTER — Ambulatory Visit (HOSPITAL_BASED_OUTPATIENT_CLINIC_OR_DEPARTMENT_OTHER): Payer: BC Managed Care – PPO | Admitting: Hematology and Oncology

## 2022-11-11 ENCOUNTER — Encounter: Payer: Self-pay | Admitting: *Deleted

## 2022-11-11 DIAGNOSIS — D0510 Intraductal carcinoma in situ of unspecified breast: Secondary | ICD-10-CM

## 2022-11-11 NOTE — Telephone Encounter (Signed)
Per 2/28 IB reached out to patient, patient stated she spoke with someone already and wanted to cancel.

## 2022-11-11 NOTE — Progress Notes (Signed)
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  Sentinel lymph nodes negative for malignancy.  Oncotype DX resulted at 29, her risk of recurrence of breast cancer at 9 years is approximately 18%, there is benefit of chemotherapy demonstrated in this group of patients.   Re-excised margin positive, pt refused further surgery. I have hence recommended that she consider adjuvant docetaxel and cyclophosphamide every 21 days for 4 cycles for adjuvant therapy.   I called her today and she tells me that she chose to omit chemotherapy and proceed with radiation. She clearly understands the benefit and risks. She will proceed with radiation and will return to Med onc for consideration of anti estrogen therapy.  HISTORY OF PRESENTING ILLNESS:  Mercedes Fisher 52 y.o. female is here because of recent diagnosis of right breast IDC and ILC  I reviewed her records extensively and collaborated the history with the patient.  SUMMARY OF ONCOLOGIC HISTORY: Oncology History  DCIS (ductal carcinoma in situ) of breast  07/14/2022 Mammogram   Diagnostic bilateral mammogram done on July 14, 2022 showed a broad area of calcifications in the lower inner quadrant of the right breast.  Within this area of calcifications that are similar to the surrounding calcifications  and warrant biopsy.  No evidence of malignancy in the left breast.   08/18/2022 Initial Diagnosis   DCIS (ductal carcinoma in situ) of breast    Pathology Results   Pathology results showed intermediate nuclear grade DCIS in both sides sites Top-Hat clip and a Q clip.  Prognostic showed ER/PR positivity.    Mercedes Fisher is here for follow-up.  Since her last visit she had margin reexcision which once again shows DCIS involving superior margin, incidental intraductal papilloma, 3 sentinel lymph nodes negative for cancer.   This is just to follow up on her decision regarding chemotherapy recommendations.  MEDICAL HISTORY:  Past Medical History:  Diagnosis Date   Anemia    took Iron supplements    Cancer ([place])    breast cancer   Family history of adverse reaction to anesthesia    mom hard to wake up after anesthesia    SURGICAL HISTORY: Past Surgical History:  Procedure Laterality Date   BREAST BIOPSY  09/15/2022   MM RT RADIOACTIVE SEED LOC MAMMO GUIDE 09/15/2022 [facility]   BREAST BIOPSY  09/15/2022   MM RT RADIOACTIVE SEED EA ADD LESION LOC MAMMO GUIDE 09/15/2022 [facility]   BREAST BIOPSY  09/15/2022   MM RT RADIOACTIVE SEED EA ADD LESION LOC MAMMO GUIDE 09/15/2022 [facility]   BREAST BIOPSY  09/15/2022   MM RT RADIOACTIVE SEED EA ADD LESION LOC MAMMO GUIDE 09/15/2022 [facility]   BREAST LUMPECTOMY WITH RADIOACTIVE SEED LOCALIZATION Right 09/16/2022   Procedure: RIGHT BREAST BRACKETED LUMPECTOMY WITH RADIOACTIVE SEED LOCALIZATION;  Surgeon: Erroll Luna, MD;  Location: [facility];  Service: General;  Laterality: Right;  LAPAROSCOPIC BILATERAL SALPINGECTOMY Bilateral 06/14/2015   Procedure: LAPAROSCOPIC BILATERAL SALPINGECTOMY;  Surgeon: Servando Salina, MD;  Location: [facility];  Service: Gynecology;  Laterality: Bilateral;   RE-EXCISION OF BREAST LUMPECTOMY Right 10/07/2022   Procedure: RE-EXCISION OF RIGHT BREAST LUMPECTOMY;  Surgeon: Erroll Luna, MD;  Location: [facility];  Service: General;  Laterality: Right;   ROBOTIC ASSISTED TOTAL HYSTERECTOMY N/A 06/14/2015   Procedure: ROBOTIC ASSISTED TOTAL HYSTERECTOMY With Morcellation;  Surgeon: Servando Salina, MD;  Location: [facility];  Service: Gynecology;  Laterality: N/A;   SENTINEL NODE BIOPSY Right 10/07/2022   Procedure: RIGHT SENTINEL NODE BIOPSY;  Surgeon: Erroll Luna, MD;  Location: [facility];  Service: General;  Laterality: Right;    SOCIAL HISTORY: Social History   Socioeconomic History   Marital status: Married    Spouse name: Not on file   Number of children: Not on file   Years of education: Not on file   Highest education level: Not on file  Occupational History   Not on file  Tobacco Use   Smoking status: Never   Smokeless tobacco: Never  Vaping Use   Vaping Use: Never used  Substance and Sexual Activity   Alcohol use: No   Drug use: No   Sexual activity: Not on file  Other Topics Concern   Not on file  Social History Narrative   Not on file   Social Determinants of Health   Financial Resource Strain: Not on file  Food Insecurity: Not on file  Transportation Needs: No Transportation Needs (08/27/2022)   PRAPARE - Hydrologist (Medical): No    Lack of Transportation (Non-Medical): No  Physical Activity: Not on file  Stress: Not on file  Social Connections: Not on file  Intimate Partner Violence: Not on file    FAMILY HISTORY: Family History  Problem Relation Age of Onset   Pancreatic cancer Paternal Aunt    Breast cancer Paternal Aunt        dx 45s   Breast cancer Paternal Grandmother        dx 67s    ALLERGIES:  is allergic to chlorhexidine and latex.  MEDICATIONS:  Current Outpatient Medications  Medication Sig Dispense Refill   acetaminophen (TYLENOL) 500 MG tablet Take 500 mg by mouth every 6 (six) hours as needed for mild pain.     ibuprofen (ADVIL) 800 MG tablet Take 1 tablet (800 mg total) by mouth  every 8 (eight) hours as needed. 30 tablet 0   Multiple Vitamin (MULTIVITAMIN WITH MINERALS) TABS tablet Take 1 tablet by mouth daily. (Patient not taking: Reported on 10/02/2022)     oxyCODONE (OXY IR/ROXICODONE) 5 MG immediate release tablet Take 1 tablet (5 mg total) by mouth every 6 (six) hours as needed for severe pain. 15 tablet 0   No current facility-administered medications for this visit.    REVIEW OF SYSTEMS:   Constitutional: Denies fevers, chills or abnormal night sweats Eyes: Denies blurriness of vision, double vision or watery eyes Ears, nose, mouth, throat, and face: Denies mucositis or sore throat Respiratory: Denies cough, dyspnea or wheezes Cardiovascular: Denies palpitation, chest discomfort or lower extremity swelling Gastrointestinal:  Denies nausea, heartburn or change in bowel habits Skin: Denies abnormal skin rashes Lymphatics: Denies new lymphadenopathy or easy bruising Neurological:Denies numbness, tingling or new weaknesses Behavioral/Psych: Mood is stable, no new changes  Breast: Denies any palpable lumps or discharge All other systems were reviewed with the patient and are negative.  PHYSICAL EXAMINATION:  ECOG PERFORMANCE STATUS: 0 - Asymptomatic  Physical examination deferred , telephone visit.  LABORATORY DATA:  I have reviewed the data as listed Lab Results  Component Value Date   WBC 5.5 10/07/2022   HGB 14.2 10/07/2022   HCT 42.0 10/07/2022   MCV 88.2 10/07/2022   PLT 305 10/07/2022   Lab Results  Component Value Date   NA 137 06/15/2015   K 3.5 06/15/2015   CL 105 06/15/2015   CO2 25 06/15/2015    RADIOGRAPHIC STUDIES: I have personally reviewed the radiological reports and agreed with the findings in the report.  I connected with  Madden Brueggemann on 11/11/22 by a telephone  application and verified that I am speaking with the correct person using two identifiers.   I discussed the limitations of evaluation and management by  telemedicine. The patient expressed understanding and agreed to proceed.  Time spent 5 minutes including history, physical exam, review of records, counseling and coordination of care and All questions were answered. The patient knows to call the clinic with any problems, questions or concerns.    Benay Pike, MD 11/11/22

## 2022-11-12 ENCOUNTER — Ambulatory Visit: Payer: 59 | Admitting: Hematology and Oncology

## 2022-11-18 ENCOUNTER — Telehealth: Payer: Self-pay | Admitting: Radiation Oncology

## 2022-11-18 NOTE — Telephone Encounter (Signed)
Pt called requesting CT SIM appt. After looking over previous appts, it was noted last appts with RadOnc were cx due to needing more sx at that time. Pt was advised our team would f/u with MedOnc team to verify she is ready to be scheduled for XRT. Inbasket sent to nurse navigator. Pt was advised once we knew more, she would be contacted to schedule accordingly; pt verbalized understanding.

## 2022-11-24 ENCOUNTER — Encounter: Payer: Self-pay | Admitting: *Deleted

## 2022-11-24 ENCOUNTER — Inpatient Hospital Stay: Payer: BC Managed Care – PPO | Attending: Hematology and Oncology | Admitting: Licensed Clinical Social Worker

## 2022-11-24 DIAGNOSIS — D0510 Intraductal carcinoma in situ of unspecified breast: Secondary | ICD-10-CM

## 2022-11-24 NOTE — Progress Notes (Signed)
[facility] CSW Counseling Note  Patient was referred by self. Treatment type: Individual   Presenting Concerns: Patient and/or family reports the following symptoms/concerns: stress Duration of problem: a few weeks; Severity of problem: mild   Orientation:oriented to person, place, time/date, and situation.   Affect: Appropriate and Congruent Risk of harm to self or others: No plan to harm self or others  Patient and/or Family's Strengths/Protective Factors: Social connections, Social and Emotional competence, and Concrete supports in place (healthy food, safe environments, etc.)Ability for insight  Capable of independent living  Communication skills  Motivation for treatment/growth      Goals Addressed: Patient will:  Reduce symptoms of: stress Increase healthy adjustment to current life circumstances   Progress towards Goals: Progressing   Interventions: Interventions utilized:  Strength-based and Supportive      Assessment: Patient continues to cope well and implement her positive coping strategies. She was very excited to take a bath this weekend and talked today about lifestyle changes she has implemented with eating, supplements, and sleep.  Updated on home stressors and pt has a plan moving forward.    Pt overall able to identify areas she has grown through this cancer process so far (advocating for herself, knowing what she wants for her body, relationship with her daughter, healthy lifestyle, mental health management) and is focusing on that growth. She is also ready to start her radiation treatment and then is going to meet with a functional medicine doctor to discuss holistic wellbeing.    Plan: Follow up with CSW: 3 weeks Behavioral recommendations: continue positive coping skills. Work on Location manager for assistance and notify CSW with any questions. Referral(s): Support group(s):  breast group , tai chi, yoga, peer mentor, Reiki. Will sign up for [employer] in  March       Cisco Shayn Madole, LCSW

## 2022-11-25 ENCOUNTER — Encounter: Payer: Self-pay | Admitting: *Deleted

## 2022-11-25 ENCOUNTER — Telehealth: Payer: Self-pay | Admitting: Radiation Oncology

## 2022-11-25 ENCOUNTER — Other Ambulatory Visit: Payer: BC Managed Care – PPO | Admitting: Licensed Clinical Social Worker

## 2022-11-25 NOTE — Telephone Encounter (Signed)
LVM to advise pt that referral was received and schedule next available FUN/SIM

## 2022-11-26 NOTE — Progress Notes (Signed)
Location of Breast Cancer: Mixed invasive lobular and ductal carcinoma of right breast  Histology per Pathology Report:  10-07-22 FINAL MICROSCOPIC DIAGNOSIS: A. RIGHT BREAST, SUPERIOR LATERAL MARGIN, RE-EXCISION: - Ductal carcinoma in situ, solid and cribriform, intermediate nuclear grade - DCIS involves superior margin - Previous biopsy site changes B. RIGHT BREAST, INFERIOR POSTERIOR MARGIN, RE-EXCISION: - Incidental intraductal papilloma (focally involving inferior margin) - Previous biopsy site changes - Negative for carcinoma C. RIGHT BREAST, ANTERIOR MEDIAL MARGIN, RE-EXCISION: - Ductal carcinoma in situ, solid and cribriform type, intermediate nuclear grade - DCIS 1.5 mm from anterior margin - Previous biopsy site changes D. RIGHT SENTINEL LYMPH NODE, EXCISION: - One benign lymph node, negative for carcinoma (0/1) E. RIGHT SENTINEL LYMPH NODE, EXCISION: - One benign lymph node, negative for carcinoma (0/1) F. RIGHT SENTINEL LYMPH NODE, EXCISION: - One benign lymph node, negative for carcinoma (0/1) G. RIGHT SENTINEL LYMPH NODE, EXCISION: - One benign lymph node, negative for carcinoma (0/1) H. RIGHT SENTINEL LYMPH NODE, EXCISION: - One benign lymph node, negative for carcinoma (0/1) COMMENT: A.  Immunohistochemistry for cytokeratin 7 E-cadherin, calponin and smooth muscle myosin supports the diagnosis. C-H. Immunohistochemistry for cytokeratin 7 is performed on the sentinel lymph nodes and no metastatic carcinoma is identified.  09-16-22 FINAL MICROSCOPIC DIAGNOSIS: A. BREAST, RADIOACTIVE SEED, RIGHT, LUMPECTOMY: - Focus of mixed invasive lobular and invasive ductal carcinoma, 0.6 cm, grade 2.  See comment - Ductal carcinoma in situ, intermediate grade - Invasive carcinoma is less than 1 mm from the superior margin - Biopsy site changes - See oncology table B. BREAST, RADIOACTIVE SEED, RIGHT, CENTRAL, EXCISION: - Ductal carcinoma in situ, intermediate grade - Resection  margins are negative for DCIS; closest to the anterior margin at 1 mm - Intraductal papilloma with usual ductal hyperplasia - Biopsy site changes C. BREAST, RADIOACTIVE SEED, RIGHT, INFERIOR, EXCISION: - Ductal carcinoma in situ, intermediate grade - Inked new inferior margin appears negative for DCIS D. BREAST, RADIOACTIVE SEED, RIGHT, ADDITIONAL INFERIOR MARGIN, EXCISION: - Ductal carcinoma in situ, intermediate grade - The new inked inferior resection margin is 0.2 cm from DCIS - Negative for invasive carcinoma COMMENT: A.  Majority of the invasive tumor is negative for E-cadherin, consistent with a lobular phenotype however a small portion (0.3 cm) shows retained E-cadherin expression, consistent with mixed lobular and ductal phenotype.  Immunostain for CK5/6 confirms presence of ductal carcinoma in situ.  Dr. Brantley Stage was notified on 09/23/2022.  Receptor Status: ER(95% positive), PR (5% positive), Her2-neu (1+), Ki-67(25%)  Did patient present with symptoms (if so, please note symptoms) or was this found on screening mammography?: routine mammogram  Past/Anticipated interventions by surgeon, if any: 12/01/2022 --Dr. Erroll Luna (office visit) Doing well She is opted for radiation therapy and the plan is for antiestrogen therapy. Return in 6 months   02/05/202 --Dr. Erroll Luna (office visit) Patient with continued positive margins despite 2 lumpectomies. Discussed the significance of this with her recommended reexcision and/or mastectomy to reduce risk of local regional recurrence. Even with radiation therapy or restoration of increased. After lengthy discussion of the above with her, she does not wish to pursue any further surgery at this point in time. She is aware of these increased risks that we discussed in with her and her family today. I will let both medical and radiation oncology know so that if they wish to have any further conversation with her they may do so.  Otherwise, I will see her back in 6 weeks for recheck.  10/07/2022 --Dr. Marcello Moores [place] Right breast reexcision lumpectomy with right axillary sentinel lymph node mapping using MAC trace   09/16/2022 --Dr. Marcello Moores [place] Right breast seed localized lumpectomy using 4 seeds   Past/Anticipated interventions by medical oncology, if any:  Dr. 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   Sentinel lymph nodes negative for malignancy. Oncotype DX resulted at 29, her risk of recurrence of breast cancer at 9 years is approximately 18%, there is benefit of chemotherapy demonstrated in this group of patients.   Re-excised margin positive, pt refused further surgery. I have hence recommended that she consider adjuvant docetaxel and cyclophosphamide every 21 days for 4 cycles for adjuvant therapy.   I called her today and she tells me that she chose to omit chemotherapy and proceed with radiation.  She clearly understands the benefit and risks. She will proceed with radiation and will return to Med onc for consideration of anti estrogen therapy.   Lymphedema issues, if any:  Denies    Pain issues, if any:  Reports minimal discomfort (states it's manageable without any OTC pain medication)   SAFETY ISSUES: Prior radiation? No Pacemaker/ICD? No Possible current pregnancy? No--hysterectomy Is the patient on methotrexate? No  Current Complaints / other details:  Nothing else of note

## 2022-12-03 NOTE — Progress Notes (Signed)
Radiation Oncology         [phone] ________________________________  Name: Mercedes Fisher MRN: [id number]  Date: 12/04/2022  DOB: [birth year]  Follow-Up Visit Note  Outpatient  CC: Berkley Harvey, NP  Benay Pike, MD  Diagnosis:   C50.111   ICD-10-CM   1. Ductal carcinoma in situ (DCIS) of right breast  D05.11     2. 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, estrogen receptor positive ([place]) Staging form: Breast, AJCC 8th Edition - Pathologic stage from 12/04/2022: Stage IA (pT1b, pN0, cM0, G2, ER+, PR+, HER2-) - Signed by Eppie Gibson, MD on 12/04/2022 Histologic grading system: 3 grade system  DCIS (ductal carcinoma in situ) of breast Staging form: Breast, AJCC 8th Edition - Clinical stage from 08/18/2022: Stage 0 (cTis (DCIS), cN0, cM0, ER+, PR+, HER2: Not Assessed) - Unsigned Stage prefix: Initial diagnosis Method of lymph node assessment: Clinical Nuclear grade: G2    Right Breast LIQ, 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     Since her consultation date of 08/18/22, she underwent genetic testing on 08/24/22. Results showed no clinically significant variants detected by +RNAinsight testing.  She opted to proceed with a right breast lumpectomy without nodal biopsies on 09/16/22 under the care of Dr. Brantley Stage. Pathology from the procedure revealed: tumor size of 6 mm; histology of a focus of grade 2 mixed invasive ductal and invasive lobular carcinoma with intermediate grade DCIS; margin status to invasive disease of <1  mm from the superior margin; margin status to in situ disease of 2 mm from the inferior margin; no lymph nodes were examined;  ER status: 95% positive and PR status 5% positive, both with strong staining intensity; Proliferation marker Ki67 at 25%; Her2 status negative; Grade 2.  In light of final pathology showing close margins, the patient underwent re-excision of the close margins, as well as SLN biopsies, on 10/07/22 under the care of Dr. Brantley Stage. Pathology from the procedure revealed: superior-lateral margins and anterior-medial margins positive for DCIS; inferior posterior margin with intraductal papilloma (negative for carcinoma); nodal status of 5/5 right axillary sentinel lymph nodes excisions negative for carcinoma.     Oncotype DX was obtained on the final surgical sample and the recurrence score of 29 predicts a risk of recurrence outside the breast over the next 9 years of 18%, if the patient's only systemic therapy were to be an antiestrogen for 5 years.  It also predicted a benefit from chemotherapy.  Although her margins remained positive after re-excision, the patient does not wish to pursue any further surgical interventions. Dr. Brantley Stage has informed her of risks associated with this decision including local recurrence even with XRT.   Post-operatively, the patient developed a moderate right axillary seroma. This was not aspirated, and it did not cause her any pain or discomfort.   Based on Oncotype results, Dr. Chryl Heck recommended that she consider adjuvant chemotherapy consisting of docetaxel and cyclophosphamide every 21 days x 4 cycles, followed by XRT. However after giving it some thought, the patient has  opted to not pursue chemotherapy and proceed with adjuvant radiation therapy. The patient will return to Dr. Chryl Heck following XRT to discuss the role of antiestrogen therapy.   Symptomatically, the patient reports:  Lymphedema issues, if any:  Denies    Pain issues, if any:   Reports minimal discomfort (states it's manageable without any OTC pain medication)   SAFETY ISSUES: Prior radiation? No Pacemaker/ICD? No Possible current pregnancy? No--hysterectomy Is the patient on methotrexate? No  Current Complaints / other details:   She continues to work for [employer] at [employer].  She feels secure in her decision to decline further surgery despite the positive margins.  She is here with supportive family today        ALLERGIES:  is allergic to chlorhexidine and latex.  Meds: Current Outpatient Medications  Medication Sig Dispense Refill   ascorbic acid (VITAMIN C) 100 MG tablet Take 100 mg by mouth daily.     ELDERBERRY PO Take 1 capsule by mouth daily.     acetaminophen (TYLENOL) 500 MG tablet Take 500 mg by mouth every 6 (six) hours as needed for mild pain.     B Complex Vitamins (VITAMIN B COMPLEX PO) Take 1 capsule by mouth daily.     ERGOCALCIFEROL PO Take 1 capsule by mouth daily.     ibuprofen (ADVIL) 800 MG tablet Take 1 tablet (800 mg total) by mouth every 8 (eight) hours as needed. 30 tablet 0   MAGNESIUM OXIDE, ELEMENTAL, PO Take 1 tablet by mouth daily.     Multiple Vitamin (MULTIVITAMIN WITH MINERALS) TABS tablet Take 1 tablet by mouth daily. (Patient not taking: Reported on 10/02/2022)     oxyCODONE (OXY IR/ROXICODONE) 5 MG immediate release tablet Take 1 tablet (5 mg total) by mouth every 6 (six) hours as needed for severe pain. 15 tablet 0   No current facility-administered medications for this encounter.    Physical Findings:  height is 5\' 7"  (1.702 m) and weight is 208 lb 12.8 oz (94.7 kg). Her temperature is 98.8 F (37.1 C). Her blood pressure is 143/90 (abnormal) and her pulse is 105 (abnormal). Her respiration is 18 and oxygen saturation is 97%. .     General: Alert and oriented, in no acute distress HEENT: Head is normocephalic. Extraocular movements are intact. Musculoskeletal: symmetric strength and muscle tone throughout.   Well-nourished, ambulatory Neurologic: No obvious focalities. Speech is fluent.  Psychiatric: Judgment and insight are intact. Affect is appropriate. Breast exam reveals satisfactory healing of right axillary and right periareolar breast conserving surgery scars; there is some chronic irritation in the right inframammary fold but the skin is intact, albeit erythematous  Lab Findings: Lab Results  Component Value Date   WBC 5.5 10/07/2022   HGB 14.2 10/07/2022   HCT 42.0 10/07/2022   MCV 88.2 10/07/2022   PLT 305 10/07/2022     Radiographic Findings: No results found.  Impression/Plan: Right breast cancer with positive margins, patient is declining further surgery We discussed adjuvant radiotherapy today.  I recommend radiation therapy to the right breast over 21 fractions, using standard hypofractionation with a lumpectomy boost, in order to reduce risk of in breast recurrence.  I reviewed the logistics, benefits, risks, and potential side effects of this treatment in detail. Risks may include but not necessary be limited to acute and late injury tissue in the radiation fields such as skin irritation (change in color/pigmentation, itching, dryness, pain, peeling). She may experience fatigue. We also discussed possible risk of  long term cosmetic changes or scar tissue. There is also a smaller risk for lung toxicity, brachial plexopathy, lymphedema, musculoskeletal changes, rib fragility or induction of a second malignancy, late chronic non-healing soft tissue wound.    The patient asked good questions which I answered to her satisfaction. She is enthusiastic about proceeding with treatment. A consent form has been previously signed and placed in her chart.  We will plan her treatment today and start her treatment in early April.   On date of service, in total, I spent 30 minutes on this encounter. Patient was seen in person.  _____________________________________   Eppie Gibson, MD  This  document serves as a record of services personally performed by Eppie Gibson, MD. It was created on her behalf by Roney Mans, a trained medical scribe. The creation of this record is based on the scribe's personal observations and the provider's statements to them. This document has been checked and approved by the attending provider.

## 2022-12-04 ENCOUNTER — Ambulatory Visit
Admission: RE | Admit: 2022-12-04 | Discharge: 2022-12-04 | Disposition: A | Discharge status: Home / Self Care | Payer: BC Managed Care – PPO | Source: Ambulatory Visit | Attending: Radiation Oncology | Admitting: Radiation Oncology

## 2022-12-04 ENCOUNTER — Encounter: Payer: Self-pay | Admitting: Radiation Oncology

## 2022-12-04 VITALS — BP 143/90 | HR 105 | Temp 98.8°F | Resp 18 | Ht 67.0 in | Wt 208.8 lb

## 2022-12-04 DIAGNOSIS — Z170 Estrogen receptor positive status [ER+]: Secondary | ICD-10-CM

## 2022-12-04 DIAGNOSIS — D0511 Intraductal carcinoma in situ of right breast: Secondary | ICD-10-CM

## 2022-12-04 DIAGNOSIS — Z79899 Other long term (current) drug therapy: Secondary | ICD-10-CM | POA: Diagnosis not present

## 2022-12-04 DIAGNOSIS — C50111 Malignant neoplasm of central portion of right female breast: Secondary | ICD-10-CM | POA: Diagnosis present

## 2022-12-08 ENCOUNTER — Encounter: Payer: Self-pay | Admitting: Dietician

## 2022-12-08 NOTE — Progress Notes (Signed)
Patient participated in the Nutrition and Cancer webinar. AICR guidelines were reviewed.  [facility] nutrition resources for survivors were encouraged (Nutrition 101, cooking classes, individual MNT with outpatient RDs in weight management and DM management).  Contact information provided.  Cyndi Torianna Junio, RDN, LDN Registered Dietitian, [facility] Part Time Remote (Usual office hours: Tuesday-Thursday) Mobile: [phone] Remote Office: [phone]  

## 2022-12-10 ENCOUNTER — Inpatient Hospital Stay: Payer: BC Managed Care – PPO | Admitting: Dietician

## 2022-12-10 ENCOUNTER — Encounter: Payer: Self-pay | Admitting: *Deleted

## 2022-12-11 DIAGNOSIS — D0511 Intraductal carcinoma in situ of right breast: Secondary | ICD-10-CM | POA: Diagnosis not present

## 2022-12-15 ENCOUNTER — Telehealth: Payer: Self-pay | Admitting: Hematology and Oncology

## 2022-12-15 NOTE — Telephone Encounter (Signed)
Spoke with patient confirming upcoming appointment  

## 2022-12-16 ENCOUNTER — Ambulatory Visit
Admission: RE | Admit: 2022-12-16 | Discharge: 2022-12-16 | Disposition: A | Discharge status: Home / Self Care | Payer: BC Managed Care – PPO | Source: Ambulatory Visit | Attending: Radiation Oncology | Admitting: Radiation Oncology

## 2022-12-16 ENCOUNTER — Other Ambulatory Visit: Payer: Self-pay

## 2022-12-16 ENCOUNTER — Inpatient Hospital Stay: Payer: BC Managed Care – PPO | Admitting: Licensed Clinical Social Worker

## 2022-12-16 DIAGNOSIS — C50111 Malignant neoplasm of central portion of right female breast: Secondary | ICD-10-CM | POA: Diagnosis present

## 2022-12-16 DIAGNOSIS — Z170 Estrogen receptor positive status [ER+]: Secondary | ICD-10-CM | POA: Insufficient documentation

## 2022-12-16 DIAGNOSIS — C50911 Malignant neoplasm of unspecified site of right female breast: Secondary | ICD-10-CM | POA: Diagnosis present

## 2022-12-16 LAB — RAD ONC ARIA SESSION SUMMARY
Course Elapsed Days: 0
Plan Fractions Treated to Date: 1
Plan Prescribed Dose Per Fraction: 2.66 Gy
Plan Total Fractions Prescribed: 16
Plan Total Prescribed Dose: 42.56 Gy
Reference Point Dosage Given to Date: 2.66 Gy
Reference Point Session Dosage Given: 2.66 Gy
Session Number: 1

## 2022-12-17 ENCOUNTER — Other Ambulatory Visit: Payer: BC Managed Care – PPO | Admitting: Licensed Clinical Social Worker

## 2022-12-17 ENCOUNTER — Other Ambulatory Visit: Payer: Self-pay

## 2022-12-17 ENCOUNTER — Ambulatory Visit
Admission: RE | Admit: 2022-12-17 | Discharge: 2022-12-17 | Disposition: A | Discharge status: Home / Self Care | Payer: BC Managed Care – PPO | Source: Ambulatory Visit | Attending: Radiation Oncology | Admitting: Radiation Oncology

## 2022-12-17 DIAGNOSIS — C50911 Malignant neoplasm of unspecified site of right female breast: Secondary | ICD-10-CM | POA: Diagnosis not present

## 2022-12-17 DIAGNOSIS — Z170 Estrogen receptor positive status [ER+]: Secondary | ICD-10-CM

## 2022-12-17 LAB — RAD ONC ARIA SESSION SUMMARY
Course Elapsed Days: 1
Plan Fractions Treated to Date: 2
Plan Prescribed Dose Per Fraction: 2.66 Gy
Plan Total Fractions Prescribed: 16
Plan Total Prescribed Dose: 42.56 Gy
Reference Point Dosage Given to Date: 5.32 Gy
Reference Point Session Dosage Given: 2.66 Gy
Session Number: 2

## 2022-12-17 MED ORDER — ALRA NON-METALLIC DEODORANT (RAD-ONC)
1.0000 | Freq: Once | TOPICAL | Status: AC
  Administered 2022-12-17: 1 via TOPICAL

## 2022-12-17 MED ORDER — RADIAPLEXRX EX GEL: Freq: Once | CUTANEOUS | Status: AC

## 2022-12-18 ENCOUNTER — Ambulatory Visit
Admission: RE | Admit: 2022-12-18 | Discharge: 2022-12-18 | Disposition: A | Discharge status: Home / Self Care | Payer: BC Managed Care – PPO | Source: Ambulatory Visit | Attending: Radiation Oncology | Admitting: Radiation Oncology

## 2022-12-18 ENCOUNTER — Other Ambulatory Visit: Payer: Self-pay

## 2022-12-18 DIAGNOSIS — C50911 Malignant neoplasm of unspecified site of right female breast: Secondary | ICD-10-CM | POA: Diagnosis not present

## 2022-12-18 LAB — RAD ONC ARIA SESSION SUMMARY
Course Elapsed Days: 2
Plan Fractions Treated to Date: 3
Plan Prescribed Dose Per Fraction: 2.66 Gy
Plan Total Fractions Prescribed: 16
Plan Total Prescribed Dose: 42.56 Gy
Reference Point Dosage Given to Date: 7.98 Gy
Reference Point Session Dosage Given: 2.66 Gy
Session Number: 3

## 2022-12-21 ENCOUNTER — Other Ambulatory Visit: Payer: Self-pay

## 2022-12-21 ENCOUNTER — Ambulatory Visit: Payer: BC Managed Care – PPO

## 2022-12-21 ENCOUNTER — Ambulatory Visit
Admission: RE | Admit: 2022-12-21 | Discharge: 2022-12-21 | Disposition: A | Discharge status: Home / Self Care | Payer: BC Managed Care – PPO | Source: Ambulatory Visit | Attending: Radiation Oncology | Admitting: Radiation Oncology

## 2022-12-21 DIAGNOSIS — C50911 Malignant neoplasm of unspecified site of right female breast: Secondary | ICD-10-CM | POA: Diagnosis not present

## 2022-12-21 DIAGNOSIS — C50111 Malignant neoplasm of central portion of right female breast: Secondary | ICD-10-CM

## 2022-12-21 LAB — RAD ONC ARIA SESSION SUMMARY
Course Elapsed Days: 5
Plan Fractions Treated to Date: 4
Plan Prescribed Dose Per Fraction: 2.66 Gy
Plan Total Fractions Prescribed: 16
Plan Total Prescribed Dose: 42.56 Gy
Reference Point Dosage Given to Date: 10.64 Gy
Reference Point Session Dosage Given: 2.66 Gy
Session Number: 4

## 2022-12-21 MED ORDER — RADIAPLEXRX EX GEL: Freq: Once | CUTANEOUS | Status: AC

## 2022-12-21 MED ORDER — ALRA NON-METALLIC DEODORANT (RAD-ONC)
1.0000 | Freq: Once | TOPICAL | Status: AC
  Administered 2022-12-21: 1 via TOPICAL

## 2022-12-21 NOTE — Progress Notes (Signed)
Pt here for patient teaching. Pt given Radiation and You booklet, skin care instructions, Alra deodorant, and Radiaplex gel. Reviewed areas of pertinence such as fatigue, hair loss, nausea and vomiting, skin changes, breast tenderness, breast swelling, and taste changes. Pt able to give teach back of to pat skin, use unscented/gentle soap, and drink plenty of water, apply Radiaplex bid, avoid applying anything to skin within 4 hours of treatment, avoid wearing an under wire bra, and to use an electric razor if they must shave. Pt verbalizes understanding of information given and will contact nursing with any questions or concerns.     [email or web address]      

## 2022-12-22 ENCOUNTER — Other Ambulatory Visit: Payer: Self-pay

## 2022-12-22 ENCOUNTER — Ambulatory Visit
Admission: RE | Admit: 2022-12-22 | Discharge: 2022-12-22 | Disposition: A | Discharge status: Home / Self Care | Payer: BC Managed Care – PPO | Source: Ambulatory Visit | Attending: Radiation Oncology | Admitting: Radiation Oncology

## 2022-12-22 ENCOUNTER — Inpatient Hospital Stay: Payer: BC Managed Care – PPO | Attending: Hematology and Oncology | Admitting: Licensed Clinical Social Worker

## 2022-12-22 DIAGNOSIS — C50911 Malignant neoplasm of unspecified site of right female breast: Secondary | ICD-10-CM | POA: Diagnosis not present

## 2022-12-22 LAB — RAD ONC ARIA SESSION SUMMARY
Course Elapsed Days: 6
Plan Fractions Treated to Date: 5
Plan Prescribed Dose Per Fraction: 2.66 Gy
Plan Total Fractions Prescribed: 16
Plan Total Prescribed Dose: 42.56 Gy
Reference Point Dosage Given to Date: 13.3 Gy
Reference Point Session Dosage Given: 2.66 Gy
Session Number: 5

## 2022-12-22 NOTE — Progress Notes (Signed)
[facility] CSW Counseling Note  Patient was referred by self. Treatment type: Individual   Presenting Concerns: Patient and/or family reports the following symptoms/concerns: stress Duration of problem: a few weeks; Severity of problem: mild   Orientation:oriented to person, place, time/date, and situation.   Affect: Appropriate and Congruent Risk of harm to self or others: No plan to harm self or others  Patient and/or Family's Strengths/Protective Factors: Social connections, Social and Emotional competence, and Concrete supports in place (healthy food, safe environments, etc.)Ability for insight  Capable of independent living  Communication skills  Motivation for treatment/growth      Goals Addressed: Patient will:  Reduce symptoms of: stress Increase healthy adjustment to current life circumstances   Progress towards Goals: Progressing   Interventions: Interventions utilized:  Strength-based and Supportive      Assessment: Patient continues to cope well. She has had her first week of radiation and is doing well so far. Discussed today areas of post-traumatic growth for pt in processing feelings, approaches to problems, lifestyle changes, and more.  CSW and pt discussed concerns that may come up for pts post-treatment to build awareness and ensure strategies for coping.      Plan: Follow up with CSW: 4 weeks (after end of radiation) Behavioral recommendations: continue positive coping skills. Work on Air traffic controller for assistance and notify CSW with any questions. Referral(s): Support group(s):  breast group , tai chi, yoga, peer mentor, Reiki.       Verenise Moulin E Pooja Camuso, LCSW

## 2022-12-23 ENCOUNTER — Ambulatory Visit
Admission: RE | Admit: 2022-12-23 | Discharge: 2022-12-23 | Disposition: A | Discharge status: Home / Self Care | Payer: BC Managed Care – PPO | Source: Ambulatory Visit | Attending: Radiation Oncology | Admitting: Radiation Oncology

## 2022-12-23 ENCOUNTER — Other Ambulatory Visit: Payer: Self-pay

## 2022-12-23 DIAGNOSIS — C50911 Malignant neoplasm of unspecified site of right female breast: Secondary | ICD-10-CM | POA: Diagnosis not present

## 2022-12-23 LAB — RAD ONC ARIA SESSION SUMMARY
Course Elapsed Days: 7
Plan Fractions Treated to Date: 6
Plan Prescribed Dose Per Fraction: 2.66 Gy
Plan Total Fractions Prescribed: 16
Plan Total Prescribed Dose: 42.56 Gy
Reference Point Dosage Given to Date: 15.96 Gy
Reference Point Session Dosage Given: 2.66 Gy
Session Number: 6

## 2022-12-24 ENCOUNTER — Ambulatory Visit
Admission: RE | Admit: 2022-12-24 | Discharge: 2022-12-24 | Disposition: A | Discharge status: Home / Self Care | Payer: BC Managed Care – PPO | Source: Ambulatory Visit | Attending: Radiation Oncology | Admitting: Radiation Oncology

## 2022-12-24 ENCOUNTER — Encounter: Payer: Self-pay | Admitting: *Deleted

## 2022-12-24 ENCOUNTER — Other Ambulatory Visit: Payer: Self-pay

## 2022-12-24 DIAGNOSIS — D0510 Intraductal carcinoma in situ of unspecified breast: Secondary | ICD-10-CM

## 2022-12-24 DIAGNOSIS — C50911 Malignant neoplasm of unspecified site of right female breast: Secondary | ICD-10-CM | POA: Diagnosis not present

## 2022-12-24 LAB — RAD ONC ARIA SESSION SUMMARY
Course Elapsed Days: 8
Plan Fractions Treated to Date: 7
Plan Prescribed Dose Per Fraction: 2.66 Gy
Plan Total Fractions Prescribed: 16
Plan Total Prescribed Dose: 42.56 Gy
Reference Point Dosage Given to Date: 18.62 Gy
Reference Point Session Dosage Given: 2.66 Gy
Session Number: 7

## 2022-12-25 ENCOUNTER — Other Ambulatory Visit: Payer: Self-pay

## 2022-12-25 ENCOUNTER — Ambulatory Visit
Admission: RE | Admit: 2022-12-25 | Discharge: 2022-12-25 | Disposition: A | Discharge status: Home / Self Care | Payer: BC Managed Care – PPO | Source: Ambulatory Visit | Attending: Radiation Oncology | Admitting: Radiation Oncology

## 2022-12-25 DIAGNOSIS — C50911 Malignant neoplasm of unspecified site of right female breast: Secondary | ICD-10-CM | POA: Diagnosis not present

## 2022-12-25 LAB — RAD ONC ARIA SESSION SUMMARY
Course Elapsed Days: 9
Plan Fractions Treated to Date: 8
Plan Prescribed Dose Per Fraction: 2.66 Gy
Plan Total Fractions Prescribed: 16
Plan Total Prescribed Dose: 42.56 Gy
Reference Point Dosage Given to Date: 21.28 Gy
Reference Point Session Dosage Given: 2.66 Gy
Session Number: 8

## 2022-12-28 ENCOUNTER — Other Ambulatory Visit: Payer: Self-pay

## 2022-12-28 ENCOUNTER — Ambulatory Visit
Admission: RE | Admit: 2022-12-28 | Discharge: 2022-12-28 | Disposition: A | Discharge status: Home / Self Care | Payer: BC Managed Care – PPO | Source: Ambulatory Visit | Attending: Radiation Oncology | Admitting: Radiation Oncology

## 2022-12-28 DIAGNOSIS — C50911 Malignant neoplasm of unspecified site of right female breast: Secondary | ICD-10-CM | POA: Diagnosis not present

## 2022-12-28 LAB — RAD ONC ARIA SESSION SUMMARY
Course Elapsed Days: 12
Plan Fractions Treated to Date: 9
Plan Prescribed Dose Per Fraction: 2.66 Gy
Plan Total Fractions Prescribed: 16
Plan Total Prescribed Dose: 42.56 Gy
Reference Point Dosage Given to Date: 23.94 Gy
Reference Point Session Dosage Given: 2.66 Gy
Session Number: 9

## 2022-12-29 ENCOUNTER — Other Ambulatory Visit: Payer: Self-pay

## 2022-12-29 ENCOUNTER — Ambulatory Visit
Admission: RE | Admit: 2022-12-29 | Discharge: 2022-12-29 | Disposition: A | Discharge status: Home / Self Care | Payer: BC Managed Care – PPO | Source: Ambulatory Visit | Attending: Radiation Oncology | Admitting: Radiation Oncology

## 2022-12-29 DIAGNOSIS — C50911 Malignant neoplasm of unspecified site of right female breast: Secondary | ICD-10-CM | POA: Diagnosis not present

## 2022-12-29 LAB — RAD ONC ARIA SESSION SUMMARY
Course Elapsed Days: 13
Plan Fractions Treated to Date: 10
Plan Prescribed Dose Per Fraction: 2.66 Gy
Plan Total Fractions Prescribed: 16
Plan Total Prescribed Dose: 42.56 Gy
Reference Point Dosage Given to Date: 26.6 Gy
Reference Point Session Dosage Given: 2.66 Gy
Session Number: 10

## 2022-12-30 ENCOUNTER — Other Ambulatory Visit: Payer: Self-pay

## 2022-12-30 ENCOUNTER — Ambulatory Visit
Admission: RE | Admit: 2022-12-30 | Discharge: 2022-12-30 | Disposition: A | Discharge status: Home / Self Care | Payer: BC Managed Care – PPO | Source: Ambulatory Visit | Attending: Radiation Oncology | Admitting: Radiation Oncology

## 2022-12-30 DIAGNOSIS — C50911 Malignant neoplasm of unspecified site of right female breast: Secondary | ICD-10-CM | POA: Diagnosis not present

## 2022-12-30 LAB — RAD ONC ARIA SESSION SUMMARY
Course Elapsed Days: 14
Plan Fractions Treated to Date: 11
Plan Prescribed Dose Per Fraction: 2.66 Gy
Plan Total Fractions Prescribed: 16
Plan Total Prescribed Dose: 42.56 Gy
Reference Point Dosage Given to Date: 29.26 Gy
Reference Point Session Dosage Given: 2.66 Gy
Session Number: 11

## 2022-12-31 ENCOUNTER — Other Ambulatory Visit: Payer: Self-pay

## 2022-12-31 ENCOUNTER — Ambulatory Visit
Admission: RE | Admit: 2022-12-31 | Discharge: 2022-12-31 | Disposition: A | Discharge status: Home / Self Care | Payer: BC Managed Care – PPO | Source: Ambulatory Visit | Attending: Radiation Oncology | Admitting: Radiation Oncology

## 2022-12-31 DIAGNOSIS — C50911 Malignant neoplasm of unspecified site of right female breast: Secondary | ICD-10-CM | POA: Diagnosis not present

## 2022-12-31 LAB — RAD ONC ARIA SESSION SUMMARY
Course Elapsed Days: 15
Plan Fractions Treated to Date: 12
Plan Prescribed Dose Per Fraction: 2.66 Gy
Plan Total Fractions Prescribed: 16
Plan Total Prescribed Dose: 42.56 Gy
Reference Point Dosage Given to Date: 31.92 Gy
Reference Point Session Dosage Given: 2.66 Gy
Session Number: 12

## 2023-01-01 ENCOUNTER — Other Ambulatory Visit: Payer: Self-pay

## 2023-01-01 ENCOUNTER — Ambulatory Visit
Admission: RE | Admit: 2023-01-01 | Discharge: 2023-01-01 | Disposition: A | Discharge status: Home / Self Care | Payer: BC Managed Care – PPO | Source: Ambulatory Visit | Attending: Radiation Oncology | Admitting: Radiation Oncology

## 2023-01-01 DIAGNOSIS — C50911 Malignant neoplasm of unspecified site of right female breast: Secondary | ICD-10-CM | POA: Diagnosis not present

## 2023-01-01 LAB — RAD ONC ARIA SESSION SUMMARY
Course Elapsed Days: 16
Plan Fractions Treated to Date: 13
Plan Prescribed Dose Per Fraction: 2.66 Gy
Plan Total Fractions Prescribed: 16
Plan Total Prescribed Dose: 42.56 Gy
Reference Point Dosage Given to Date: 34.58 Gy
Reference Point Session Dosage Given: 2.66 Gy
Session Number: 13

## 2023-01-04 ENCOUNTER — Ambulatory Visit
Admission: RE | Admit: 2023-01-04 | Discharge: 2023-01-04 | Disposition: A | Discharge status: Home / Self Care | Payer: BC Managed Care – PPO | Source: Ambulatory Visit | Attending: Radiation Oncology | Admitting: Radiation Oncology

## 2023-01-04 ENCOUNTER — Other Ambulatory Visit: Payer: Self-pay

## 2023-01-04 ENCOUNTER — Ambulatory Visit: Payer: BC Managed Care – PPO | Admitting: Radiation Oncology

## 2023-01-04 DIAGNOSIS — C50911 Malignant neoplasm of unspecified site of right female breast: Secondary | ICD-10-CM | POA: Diagnosis not present

## 2023-01-04 LAB — RAD ONC ARIA SESSION SUMMARY
Course Elapsed Days: 19
Plan Fractions Treated to Date: 14
Plan Prescribed Dose Per Fraction: 2.66 Gy
Plan Total Fractions Prescribed: 16
Plan Total Prescribed Dose: 42.56 Gy
Reference Point Dosage Given to Date: 37.24 Gy
Reference Point Session Dosage Given: 2.66 Gy
Session Number: 14

## 2023-01-05 ENCOUNTER — Other Ambulatory Visit: Payer: Self-pay

## 2023-01-05 ENCOUNTER — Ambulatory Visit
Admission: RE | Admit: 2023-01-05 | Discharge: 2023-01-05 | Disposition: A | Discharge status: Home / Self Care | Payer: BC Managed Care – PPO | Source: Ambulatory Visit | Attending: Radiation Oncology | Admitting: Radiation Oncology

## 2023-01-05 DIAGNOSIS — C50911 Malignant neoplasm of unspecified site of right female breast: Secondary | ICD-10-CM | POA: Diagnosis not present

## 2023-01-05 LAB — RAD ONC ARIA SESSION SUMMARY
Course Elapsed Days: 20
Plan Fractions Treated to Date: 15
Plan Prescribed Dose Per Fraction: 2.66 Gy
Plan Total Fractions Prescribed: 16
Plan Total Prescribed Dose: 42.56 Gy
Reference Point Dosage Given to Date: 39.9 Gy
Reference Point Session Dosage Given: 2.66 Gy
Session Number: 15

## 2023-01-06 ENCOUNTER — Other Ambulatory Visit: Payer: Self-pay

## 2023-01-06 ENCOUNTER — Ambulatory Visit
Admission: RE | Admit: 2023-01-06 | Discharge: 2023-01-06 | Disposition: A | Discharge status: Home / Self Care | Payer: BC Managed Care – PPO | Source: Ambulatory Visit | Attending: Radiation Oncology | Admitting: Radiation Oncology

## 2023-01-06 DIAGNOSIS — C50911 Malignant neoplasm of unspecified site of right female breast: Secondary | ICD-10-CM | POA: Diagnosis not present

## 2023-01-06 LAB — RAD ONC ARIA SESSION SUMMARY
Course Elapsed Days: 21
Plan Fractions Treated to Date: 16
Plan Prescribed Dose Per Fraction: 2.66 Gy
Plan Total Fractions Prescribed: 16
Plan Total Prescribed Dose: 42.56 Gy
Reference Point Dosage Given to Date: 42.56 Gy
Reference Point Session Dosage Given: 2.66 Gy
Session Number: 16

## 2023-01-07 ENCOUNTER — Other Ambulatory Visit: Payer: Self-pay

## 2023-01-07 ENCOUNTER — Ambulatory Visit
Admission: RE | Admit: 2023-01-07 | Discharge: 2023-01-07 | Disposition: A | Discharge status: Home / Self Care | Payer: BC Managed Care – PPO | Source: Ambulatory Visit | Attending: Radiation Oncology | Admitting: Radiation Oncology

## 2023-01-07 DIAGNOSIS — C50911 Malignant neoplasm of unspecified site of right female breast: Secondary | ICD-10-CM | POA: Diagnosis not present

## 2023-01-07 LAB — RAD ONC ARIA SESSION SUMMARY
Course Elapsed Days: 22
Plan Fractions Treated to Date: 1
Plan Prescribed Dose Per Fraction: 2 Gy
Plan Total Fractions Prescribed: 5
Plan Total Prescribed Dose: 10 Gy
Reference Point Dosage Given to Date: 2 Gy
Reference Point Session Dosage Given: 2 Gy
Session Number: 17

## 2023-01-08 ENCOUNTER — Ambulatory Visit
Admission: RE | Admit: 2023-01-08 | Discharge: 2023-01-08 | Disposition: A | Discharge status: Home / Self Care | Payer: BC Managed Care – PPO | Source: Ambulatory Visit | Attending: Radiation Oncology | Admitting: Radiation Oncology

## 2023-01-08 ENCOUNTER — Other Ambulatory Visit: Payer: Self-pay

## 2023-01-08 DIAGNOSIS — C50911 Malignant neoplasm of unspecified site of right female breast: Secondary | ICD-10-CM | POA: Diagnosis not present

## 2023-01-08 LAB — RAD ONC ARIA SESSION SUMMARY
Course Elapsed Days: 23
Plan Fractions Treated to Date: 2
Plan Prescribed Dose Per Fraction: 2 Gy
Plan Total Fractions Prescribed: 5
Plan Total Prescribed Dose: 10 Gy
Reference Point Dosage Given to Date: 4 Gy
Reference Point Session Dosage Given: 2 Gy
Session Number: 18

## 2023-01-11 ENCOUNTER — Ambulatory Visit
Admission: RE | Admit: 2023-01-11 | Discharge: 2023-01-11 | Disposition: A | Discharge status: Home / Self Care | Payer: BC Managed Care – PPO | Source: Ambulatory Visit | Attending: Radiation Oncology | Admitting: Radiation Oncology

## 2023-01-11 ENCOUNTER — Telehealth: Payer: Self-pay | Admitting: Radiation Oncology

## 2023-01-11 ENCOUNTER — Other Ambulatory Visit: Payer: Self-pay

## 2023-01-11 DIAGNOSIS — C50911 Malignant neoplasm of unspecified site of right female breast: Secondary | ICD-10-CM

## 2023-01-11 DIAGNOSIS — Z170 Estrogen receptor positive status [ER+]: Secondary | ICD-10-CM

## 2023-01-11 LAB — RAD ONC ARIA SESSION SUMMARY
Course Elapsed Days: 26
Plan Fractions Treated to Date: 3
Plan Prescribed Dose Per Fraction: 2 Gy
Plan Total Fractions Prescribed: 5
Plan Total Prescribed Dose: 10 Gy
Reference Point Dosage Given to Date: 6 Gy
Reference Point Session Dosage Given: 2 Gy
Session Number: 19

## 2023-01-11 MED ORDER — SILVER SULFADIAZINE 1 % EX CREA: TOPICAL_CREAM | Freq: Two times a day (BID) | CUTANEOUS | Status: DC

## 2023-01-11 MED ORDER — RADIAPLEXRX EX GEL
Freq: Once | CUTANEOUS | Status: AC
  Administered 2023-01-11: 1 via TOPICAL

## 2023-01-11 NOTE — Telephone Encounter (Signed)
Pt called asking to move treatment appt for today and tomorrow earlier. L3 and therapist leads were notified via email and best c/b confirmed with pt.

## 2023-01-12 ENCOUNTER — Ambulatory Visit: Payer: BC Managed Care – PPO

## 2023-01-12 ENCOUNTER — Other Ambulatory Visit: Payer: Self-pay

## 2023-01-12 ENCOUNTER — Inpatient Hospital Stay (HOSPITAL_BASED_OUTPATIENT_CLINIC_OR_DEPARTMENT_OTHER): Payer: BC Managed Care – PPO | Admitting: Hematology and Oncology

## 2023-01-12 ENCOUNTER — Ambulatory Visit
Admission: RE | Admit: 2023-01-12 | Discharge: 2023-01-12 | Disposition: A | Discharge status: Home / Self Care | Payer: BC Managed Care – PPO | Source: Ambulatory Visit | Attending: Radiation Oncology | Admitting: Radiation Oncology

## 2023-01-12 VITALS — BP 128/78 | HR 103 | Temp 97.7°F | Resp 15 | Wt 203.1 lb

## 2023-01-12 DIAGNOSIS — Z510 Encounter for antineoplastic radiation therapy: Secondary | ICD-10-CM | POA: Diagnosis present

## 2023-01-12 DIAGNOSIS — D0511 Intraductal carcinoma in situ of right breast: Secondary | ICD-10-CM | POA: Diagnosis present

## 2023-01-12 DIAGNOSIS — D0510 Intraductal carcinoma in situ of unspecified breast: Secondary | ICD-10-CM

## 2023-01-12 LAB — RAD ONC ARIA SESSION SUMMARY
Course Elapsed Days: 27
Plan Fractions Treated to Date: 4
Plan Prescribed Dose Per Fraction: 2 Gy
Plan Total Fractions Prescribed: 5
Plan Total Prescribed Dose: 10 Gy
Reference Point Dosage Given to Date: 8 Gy
Reference Point Session Dosage Given: 2 Gy
Session Number: 20

## 2023-01-12 NOTE — Progress Notes (Signed)
[facility] CONSULT NOTE  Patient Care Team: Iona Hansen, NP as PCP - General (Nurse Practitioner) Pershing Proud, RN as Oncology Nurse Navigator Donnelly Angelica, RN as Oncology Nurse Navigator Rachel Moulds, MD as Consulting Physician (Hematology and Oncology)  CHIEF COMPLAINTS/PURPOSE OF CONSULTATION:   Newly diagnosed breast cancer  Assessment and plan  This is a very pleasant 52 year old female patient with newly diagnosed right breast mixed invasive lobular and ductal carcinoma measuring 6 mm, grade 2 had reexcision because of close margins, prognostics showed ER 95% strong staining PR 5% strong staining Ki-67 of 25% and HER2 1+ who is here to discuss Oncotype results.  Sentinel lymph nodes negative for malignancy.  Oncotype DX resulted at 29, her risk of recurrence of breast cancer at 9 years is approximately 18%, there is benefit of chemotherapy demonstrated in this group of patients.   Re-excised margin positive, pt refused further surgery. She is now undergoing adjuvant radiation, last day is tomorrow. She now is refusing anti estrogen therapy. She apparently established with a functional medicine doctor, want to have a conversation with them. Given her pre menopausal status, we once again discussed about considering Tamoxifen,  We discussed that the risk of recurrence is high without anti estrogen therapy. I discussed the mechanism of action of Tamoxifen, adverse effects of tamoxifen including but not limited to post menopausal symptoms, DVT/PE, endometrial hyperplasia and cancer, benefit on bone density When breast cancer recurs, it will be metastatic and the intent of treatment will be palliative. She expressed understanding of all the recommendations.  HISTORY OF PRESENTING ILLNESS:  Mercedes Fisher 52 y.o. female is here because of recent diagnosis of right breast IDC and ILC  I reviewed her records extensively and collaborated the history with the  patient.  SUMMARY OF ONCOLOGIC HISTORY: Oncology History  DCIS (ductal carcinoma in situ) of breast  07/14/2022 Mammogram   Diagnostic bilateral mammogram done on July 14, 2022 showed a broad area of calcifications in the lower inner quadrant of the right breast.  Within this area of calcifications that are similar to the surrounding calcifications and warrant biopsy.  No evidence of malignancy in the left breast.   08/18/2022 Initial Diagnosis   DCIS (ductal carcinoma in situ) of breast    Pathology Results   Pathology results showed intermediate nuclear grade DCIS in both sides sites Top-Hat clip and a Q clip.  Prognostic showed ER/PR positivity.   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 Drohan is here for follow-up.  She is now undergoing adj radiation, tolerating it well except for skin changes. She is reluctant to consider tamoxifen, wants to consider functional pathway.  MEDICAL HISTORY:  Past Medical History:  Diagnosis Date   Anemia    took Iron supplements    Cancer (HCC)    breast cancer   Family history of adverse reaction to anesthesia    mom hard to wake up after anesthesia    SURGICAL HISTORY: Past Surgical History:  Procedure Laterality Date   BREAST BIOPSY  09/15/2022   MM RT RADIOACTIVE SEED LOC  MAMMO GUIDE 09/15/2022 [facility]   BREAST BIOPSY  09/15/2022   MM RT RADIOACTIVE SEED EA ADD LESION LOC MAMMO GUIDE 09/15/2022 [facility]   BREAST BIOPSY  09/15/2022   MM RT RADIOACTIVE SEED EA ADD LESION LOC MAMMO GUIDE 09/15/2022 [facility]    BREAST BIOPSY  09/15/2022   MM RT RADIOACTIVE SEED EA ADD LESION LOC MAMMO GUIDE 09/15/2022 [facility]   BREAST LUMPECTOMY WITH RADIOACTIVE SEED LOCALIZATION Right 09/16/2022   Procedure: RIGHT BREAST BRACKETED LUMPECTOMY WITH RADIOACTIVE SEED LOCALIZATION;  Surgeon: Harriette Bouillon, MD;  Location: [facility];  Service: General;  Laterality: Right;   LAPAROSCOPIC BILATERAL SALPINGECTOMY Bilateral 06/14/2015   Procedure: LAPAROSCOPIC BILATERAL SALPINGECTOMY;  Surgeon: Maxie Better, MD;  Location: [facility];  Service: Gynecology;  Laterality: Bilateral;   RE-EXCISION OF BREAST LUMPECTOMY Right 10/07/2022   Procedure: RE-EXCISION OF RIGHT BREAST LUMPECTOMY;  Surgeon: Harriette Bouillon, MD;  Location: [facility];  Service: General;  Laterality: Right;   ROBOTIC ASSISTED TOTAL HYSTERECTOMY N/A 06/14/2015   Procedure: ROBOTIC ASSISTED TOTAL HYSTERECTOMY With Morcellation;  Surgeon: Maxie Better, MD;  Location: [facility];  Service: Gynecology;  Laterality: N/A;   SENTINEL NODE BIOPSY Right 10/07/2022   Procedure: RIGHT SENTINEL NODE BIOPSY;  Surgeon: Harriette Bouillon, MD;  Location: [facility];  Service: General;  Laterality: Right;    SOCIAL HISTORY: Social History   Socioeconomic History   Marital status: Married    Spouse name: Not on file   Number of children: Not on file   Years of education: Not on file   Highest education level: Not on file  Occupational History   Not on file  Tobacco Use   Smoking status: Never   Smokeless tobacco: Never  Vaping Use   Vaping Use: Never used  Substance and Sexual Activity   Alcohol use: No   Drug use: No   Sexual activity: Yes    Birth control/protection: Surgical  Other Topics Concern   Not on file  Social History Narrative   Not on file   Social Determinants of Health   Financial Resource Strain: Not on file  Food Insecurity: No Food Insecurity (12/04/2022)   Hunger Vital Sign    Worried About Running Out of Food in the Last Year:  Never true    Ran Out of Food in the Last Year: Never true  Transportation Needs: No Transportation Needs (12/04/2022)   PRAPARE - Transportation    Lack of Transportation (Medical): No    Lack of Transportation (Non-Medical): No  Physical Activity: Not on file  Stress: Not on file  Social Connections: Not on file  Intimate Partner Violence: Not At Risk (12/04/2022)   Humiliation, Afraid, Rape, and Kick questionnaire    Fear of Current or Ex-Partner: No    Emotionally Abused: No    Physically Abused: No    Sexually Abused: No    FAMILY HISTORY: Family History  Problem Relation Age of Onset   Pancreatic cancer Paternal Aunt    Breast cancer Paternal Aunt        dx 80s   Breast cancer Paternal Grandmother        dx 19s    ALLERGIES:  is allergic to chlorhexidine and latex.  MEDICATIONS:  Current Outpatient Medications  Medication Sig Dispense Refill   acetaminophen (TYLENOL) 500 MG tablet Take 500 mg by mouth every 6 (six) hours as needed for mild pain.     ascorbic acid (VITAMIN C) 100 MG tablet Take 100 mg by  mouth daily.     B Complex Vitamins (VITAMIN B COMPLEX PO) Take 1 capsule by mouth daily.     ELDERBERRY PO Take 1 capsule by mouth daily.     ERGOCALCIFEROL PO Take 1 capsule by mouth daily.     ibuprofen (ADVIL) 800 MG tablet Take 1 tablet (800 mg total) by mouth every 8 (eight) hours as needed. 30 tablet 0   MAGNESIUM OXIDE, ELEMENTAL, PO Take 1 tablet by mouth daily.     Multiple Vitamin (MULTIVITAMIN WITH MINERALS) TABS tablet Take 1 tablet by mouth daily. (Patient not taking: Reported on 10/02/2022)     oxyCODONE (OXY IR/ROXICODONE) 5 MG immediate release tablet Take 1 tablet (5 mg total) by mouth every 6 (six) hours as needed for severe pain. 15 tablet 0   No current facility-administered medications for this visit.    REVIEW OF SYSTEMS:   Constitutional: Denies fevers, chills or abnormal night sweats Eyes: Denies blurriness of vision, double vision or watery  eyes Ears, nose, mouth, throat, and face: Denies mucositis or sore throat Respiratory: Denies cough, dyspnea or wheezes Cardiovascular: Denies palpitation, chest discomfort or lower extremity swelling Gastrointestinal:  Denies nausea, heartburn or change in bowel habits Skin: Denies abnormal skin rashes Lymphatics: Denies new lymphadenopathy or easy bruising Neurological:Denies numbness, tingling or new weaknesses Behavioral/Psych: Mood is stable, no new changes  Breast: Denies any palpable lumps or discharge All other systems were reviewed with the patient and are negative.  PHYSICAL EXAMINATION: ECOG PERFORMANCE STATUS: 0 - Asymptomatic  Physical examination deferred , telephone visit.  LABORATORY DATA:  I have reviewed the data as listed Lab Results  Component Value Date   WBC 5.5 10/07/2022   HGB 14.2 10/07/2022   HCT 42.0 10/07/2022   MCV 88.2 10/07/2022   PLT 305 10/07/2022   Lab Results  Component Value Date   NA 137 06/15/2015   K 3.5 06/15/2015   CL 105 06/15/2015   CO2 25 06/15/2015    RADIOGRAPHIC STUDIES: I have personally reviewed the radiological reports and agreed with the findings in the report.   All questions were answered. The patient knows to call the clinic with any problems, questions or concerns.    Rachel Moulds, MD 01/12/23

## 2023-01-13 ENCOUNTER — Other Ambulatory Visit: Payer: Self-pay

## 2023-01-13 ENCOUNTER — Telehealth: Payer: Self-pay | Admitting: Hematology and Oncology

## 2023-01-13 ENCOUNTER — Ambulatory Visit
Admission: RE | Admit: 2023-01-13 | Discharge: 2023-01-13 | Disposition: A | Discharge status: Home / Self Care | Payer: BC Managed Care – PPO | Source: Ambulatory Visit | Attending: Radiation Oncology | Admitting: Radiation Oncology

## 2023-01-13 DIAGNOSIS — C50111 Malignant neoplasm of central portion of right female breast: Secondary | ICD-10-CM | POA: Diagnosis present

## 2023-01-13 DIAGNOSIS — C50911 Malignant neoplasm of unspecified site of right female breast: Secondary | ICD-10-CM | POA: Diagnosis present

## 2023-01-13 DIAGNOSIS — Z170 Estrogen receptor positive status [ER+]: Secondary | ICD-10-CM | POA: Insufficient documentation

## 2023-01-13 LAB — RAD ONC ARIA SESSION SUMMARY
Course Elapsed Days: 28
Plan Fractions Treated to Date: 5
Plan Prescribed Dose Per Fraction: 2 Gy
Plan Total Fractions Prescribed: 5
Plan Total Prescribed Dose: 10 Gy
Reference Point Dosage Given to Date: 10 Gy
Reference Point Session Dosage Given: 2 Gy
Session Number: 21

## 2023-01-13 NOTE — Telephone Encounter (Signed)
Spoke with patient confirming upcoming appointment  

## 2023-01-18 ENCOUNTER — Inpatient Hospital Stay: Payer: BC Managed Care – PPO | Attending: Hematology and Oncology | Admitting: Hematology and Oncology

## 2023-01-18 DIAGNOSIS — D0511 Intraductal carcinoma in situ of right breast: Secondary | ICD-10-CM

## 2023-01-18 DIAGNOSIS — D0510 Intraductal carcinoma in situ of unspecified breast: Secondary | ICD-10-CM

## 2023-01-18 MED ORDER — TAMOXIFEN CITRATE 20 MG PO TABS: 20.0000 mg | ORAL_TABLET | Freq: Every day | ORAL | 0 refills | Status: DC

## 2023-01-18 NOTE — Progress Notes (Signed)
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  Sentinel lymph nodes negative for malignancy.  Oncotype DX resulted at 29, her risk of recurrence of breast cancer at 9 years is approximately 18%, there is benefit of chemotherapy demonstrated in this group of patients.   Re-excised margin positive, pt refused further surgery. She is now undergoing adjuvant radiation, last day is tomorrow. She now is refusing anti estrogen therapy. She apparently established with a functional medicine doctor, want to have a conversation with them. Given her pre menopausal status, we once again discussed about considering Tamoxifen,  We discussed that the risk of recurrence is high without anti estrogen therapy. I discussed the mechanism of action of Tamoxifen, adverse effects of tamoxifen including but not limited to post menopausal symptoms, DVT/PE, endometrial hyperplasia and cancer, benefit on bone density  This is a follow-up telephone call to see if she is interested in pursuing tamoxifen.  She tells me that she will try it for about 3 months.  I once again discussed about adverse effects including but not limited to hot flashes, risk of DVT/PE, symptoms and signs of DVT/PE.  I have strongly encouraged her to call us with any new questions or concerns.  Otherwise  we will plan to see her back in about 3 months for follow-up.   HISTORY OF PRESENTING ILLNESS:  Mercedes Fisher 52 y.o. female is here because of recent diagnosis of right breast IDC and ILC  I reviewed her records extensively and collaborated the history with the patient.  SUMMARY OF ONCOLOGIC HISTORY: Oncology History  DCIS (ductal carcinoma in situ) of breast  07/14/2022 Mammogram   Diagnostic bilateral mammogram done on July 14, 2022 showed a broad area of calcifications in the lower inner quadrant of the right breast.  Within this area of calcifications that are similar to the surrounding calcifications and warrant biopsy.  No evidence of malignancy in the left breast.   08/18/2022 Initial Diagnosis   DCIS (ductal carcinoma in situ) of breast    Pathology Results   Pathology results showed intermediate nuclear grade DCIS in both sides sites Top-Hat clip and a Q clip.  Prognostic showed ER/PR positivity.   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, estrogen receptor positive (HCC)  12/04/2022 Initial Diagnosis   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, estrogen receptor positive (HCC)   12/04/2022 Cancer Staging   Staging form: Breast, AJCC 8th Edition - Pathologic stage from 12/04/2022: Stage IA (pT1b, pN0, cM0, G2, ER+, PR+, HER2-) - Signed by Lonie Peak, MD on 12/04/2022 Histologic grading system: 3 grade system    Mercedes Fisher is here fo for telephone follow-up.  She tells me that she is going to try tamoxifen for a few months and see how she feels.  MEDICAL HISTORY:  Past Medical History:  Diagnosis Date   Anemia  took Iron supplements    Cancer (HCC)    breast cancer   Family history of adverse reaction to anesthesia    mom hard to wake up after anesthesia    SURGICAL HISTORY: Past Surgical History:  Procedure Laterality Date   BREAST BIOPSY  09/15/2022   MM RT RADIOACTIVE SEED LOC MAMMO GUIDE 09/15/2022 [facility]   BREAST  BIOPSY  09/15/2022   MM RT RADIOACTIVE SEED EA ADD LESION LOC MAMMO GUIDE 09/15/2022 [facility]   BREAST BIOPSY  09/15/2022   MM RT RADIOACTIVE SEED EA ADD LESION LOC MAMMO GUIDE 09/15/2022 [facility]   BREAST BIOPSY  09/15/2022   MM RT RADIOACTIVE SEED EA ADD LESION LOC MAMMO GUIDE 09/15/2022 [facility]   BREAST LUMPECTOMY WITH RADIOACTIVE SEED LOCALIZATION Right 09/16/2022   Procedure: RIGHT BREAST BRACKETED LUMPECTOMY WITH RADIOACTIVE SEED LOCALIZATION;  Surgeon: Harriette Bouillon, MD;  Location: [facility];  Service: General;  Laterality: Right;   LAPAROSCOPIC BILATERAL SALPINGECTOMY Bilateral 06/14/2015   Procedure: LAPAROSCOPIC BILATERAL SALPINGECTOMY;  Surgeon: Maxie Better, MD;  Location: [facility];  Service: Gynecology;  Laterality: Bilateral;   RE-EXCISION OF BREAST LUMPECTOMY Right 10/07/2022   Procedure: RE-EXCISION OF RIGHT BREAST LUMPECTOMY;  Surgeon: Harriette Bouillon, MD;  Location: [facility];  Service: General;  Laterality: Right;   ROBOTIC ASSISTED TOTAL HYSTERECTOMY N/A 06/14/2015   Procedure: ROBOTIC ASSISTED TOTAL HYSTERECTOMY With Morcellation;  Surgeon: Maxie Better, MD;  Location: [facility];  Service: Gynecology;  Laterality: N/A;   SENTINEL NODE BIOPSY Right 10/07/2022   Procedure: RIGHT SENTINEL NODE BIOPSY;  Surgeon: Harriette Bouillon, MD;  Location: [facility];  Service: General;  Laterality: Right;    SOCIAL HISTORY: Social History   Socioeconomic History   Marital status: Married    Spouse name: Not on file   Number of children: Not on file   Years of education: Not on file   Highest education level: Not on file  Occupational History   Not on file  Tobacco Use   Smoking status: Never   Smokeless tobacco: Never  Vaping Use   Vaping Use: Never used  Substance and Sexual Activity   Alcohol use: No   Drug use: No   Sexual activity: Yes    Birth control/protection: Surgical  Other Topics Concern   Not on file  Social History Narrative    Not on file   Social Determinants of Health   Financial Resource Strain: Not on file  Food Insecurity: No Food Insecurity (12/04/2022)   Hunger Vital Sign    Worried About Running Out of Food in the Last Year: Never true    Ran Out of Food in the Last Year: Never true  Transportation Needs: No Transportation Needs (12/04/2022)   PRAPARE - Transportation    Lack of Transportation (Medical): No    Lack of Transportation (Non-Medical): No  Physical Activity: Not on file  Stress: Not on file  Social Connections: Not on file  Intimate Partner Violence: Not At Risk (12/04/2022)   Humiliation, Afraid, Rape, and Kick questionnaire    Fear of Current or Ex-Partner: No    Emotionally Abused: No    Physically Abused: No    Sexually Abused: No    FAMILY HISTORY: Family History  Problem Relation Age of Onset   Pancreatic cancer Paternal Aunt    Breast cancer Paternal Aunt        dx 33s   Breast cancer Paternal Grandmother        dx 23s  ALLERGIES:  is allergic to chlorhexidine and latex.  MEDICATIONS:  Current Outpatient Medications  Medication Sig Dispense Refill   acetaminophen (TYLENOL) 500 MG tablet Take 500 mg by mouth every 6 (six) hours as needed for mild pain.     ascorbic acid (VITAMIN C) 100 MG tablet Take 100 mg by mouth daily.     B Complex Vitamins (VITAMIN B COMPLEX PO) Take 1 capsule by mouth daily.     ELDERBERRY PO Take 1 capsule by mouth daily.     ERGOCALCIFEROL PO Take 1 capsule by mouth daily.     ibuprofen (ADVIL) 800 MG tablet Take 1 tablet (800 mg total) by mouth every 8 (eight) hours as needed. 30 tablet 0   MAGNESIUM OXIDE, ELEMENTAL, PO Take 1 tablet by mouth daily.     Multiple Vitamin (MULTIVITAMIN WITH MINERALS) TABS tablet Take 1 tablet by mouth daily. (Patient not taking: Reported on 10/02/2022)     oxyCODONE (OXY IR/ROXICODONE) 5 MG immediate release tablet Take 1 tablet (5 mg total) by mouth every 6 (six) hours as needed for severe pain. 15 tablet 0    No current facility-administered medications for this visit.    REVIEW OF SYSTEMS:   Constitutional: Denies fevers, chills or abnormal night sweats Eyes: Denies blurriness of vision, double vision or watery eyes Ears, nose, mouth, throat, and face: Denies mucositis or sore throat Respiratory: Denies cough, dyspnea or wheezes Cardiovascular: Denies palpitation, chest discomfort or lower extremity swelling Gastrointestinal:  Denies nausea, heartburn or change in bowel habits Skin: Denies abnormal skin rashes Lymphatics: Denies new lymphadenopathy or easy bruising Neurological:Denies numbness, tingling or new weaknesses Behavioral/Psych: Mood is stable, no new changes  Breast: Denies any palpable lumps or discharge All other systems were reviewed with the patient and are negative.  PHYSICAL EXAMINATION: ECOG PERFORMANCE STATUS: 0 - Asymptomatic  Physical examination deferred , telephone visit.  LABORATORY DATA:  I have reviewed the data as listed Lab Results  Component Value Date   WBC 5.5 10/07/2022   HGB 14.2 10/07/2022   HCT 42.0 10/07/2022   MCV 88.2 10/07/2022   PLT 305 10/07/2022   Lab Results  Component Value Date   NA 137 06/15/2015   K 3.5 06/15/2015   CL 105 06/15/2015   CO2 25 06/15/2015    RADIOGRAPHIC STUDIES: I have personally reviewed the radiological reports and agreed with the findings in the report.  I connected with  Reia Geppert on 01/18/23 by a TELEPHONE application and verified that I am speaking with the correct person using two identifiers.   I discussed the limitations of evaluation and management by telemedicine. The patient expressed understanding and agreed to proceed.   Total time spent: 5 minutes including history, counseling and coordination of care All questions were answered. The patient knows to call the clinic with any problems, questions or concerns.    Rachel Moulds, MD 01/18/23

## 2023-01-18 NOTE — Radiation Completion Notes (Signed)
Patient Name: Mercedes Fisher, Mercedes Fisher MRN: [id number] Date of Birth: May 27, 1971 Referring Physician: Rachel Moulds, M.D. Date of Service: 2023-01-18 Radiation Oncologist: Lonie Peak, M.D. [facility] -                              RADIATION ONCOLOGY END OF TREATMENT NOTE     Diagnosis: C50.111 Malignant neoplasm of central portion of right female breast Staging on 2022-08-18: DCIS (ductal carcinoma in situ) of breast T=cTis (DCIS), N=cN0, M=cM0 Staging on 2022-12-04: 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, estrogen receptor positive (HCC) T=pT1b, N=pN0, M=cM0 Intent: Curative     ==========DELIVERED PLANS==========  First Treatment Date: 2022-12-16 - Last Treatment Date: 2023-01-13   Plan Name: Breast_R Site: Breast, Right Technique: 3D Mode: Photon Dose Per Fraction: 2.66 Gy Prescribed Dose (Delivered / Prescribed): 42.56 Gy / 42.56 Gy Prescribed Fxs (Delivered / Prescribed): 16 / 16   Plan Name: Breast_R_Bst Site: Breast, Right Technique: 3D Mode: Photon Dose Per Fraction: 2 Gy Prescribed Dose (Delivered / Prescribed): 10 Gy / 10 Gy Prescribed Fxs (Delivered / Prescribed): 5 / 5     ==========ON TREATMENT VISIT DATES========== 2022-12-21, 2022-12-28, 2023-01-04, 2023-01-11     ==========UPCOMING VISITS==========       ==========APPENDIX - ON TREATMENT VISIT NOTES==========   See weekly On Treatment Notes is Epic for details.

## 2023-01-19 ENCOUNTER — Telehealth: Payer: Self-pay | Admitting: Hematology and Oncology

## 2023-01-19 ENCOUNTER — Inpatient Hospital Stay: Payer: BC Managed Care – PPO | Admitting: Licensed Clinical Social Worker

## 2023-01-19 NOTE — Progress Notes (Signed)
[facility] CSW Counseling Note  Patient was referred by self. Treatment type: Individual   Presenting Concerns: Patient and/or family reports the following symptoms/concerns: stress Duration of problem: a few weeks; Severity of problem: mild   Orientation:oriented to person, place, time/date, and situation.   Affect: Appropriate and Congruent Risk of harm to self or others: No plan to harm self or others  Patient and/or Family's Strengths/Protective Factors: Social connections, Social and Emotional competence, and Concrete supports in place (healthy food, safe environments, etc.)Ability for insight  Capable of independent living  Communication skills  Motivation for treatment/growth      Goals Addressed: Patient will:  Reduce symptoms of: stress Increase healthy adjustment to current life circumstances   Progress towards Goals: Met   Interventions: Interventions utilized:  Strength-based and Supportive      Assessment: Patient is adjusting well post-radiation (finished last week) and continues to have a positive, resilient mindset. She will be doing a trial of Tamoxifen and is working with functional medicine provider to address other ways of maintaining health.  Pt is ready to move forward with her new normal and make changes in her personal life that have been under consideration for a while. 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       Plan: Follow up with CSW: PRN Behavioral recommendations: continue positive coping skills and outlook. Attend breast retreat this week Referral(s): Support group(s):  breast group , tai chi, yoga, peer mentor, Reiki.       Vergil Burby E Vesper Trant, LCSW

## 2023-01-19 NOTE — Telephone Encounter (Signed)
Spoke with patient confirming upcoming appointment  

## 2023-02-05 NOTE — Progress Notes (Signed)
Mercedes Fisher presents today for follow-up after completing radiation to her right breast on 01-13-23.   Pain: still having sharp, shooting intermittent pains in breast Skin: healing well, using vitamin E cream, Rn encouraged use for two more months ROM: normal ROM, though reports continued numbness in arm from nerve block she had with sx, Dr. Luisa Hart aware and stated this would improve.  Fatigue: reports good energy level Lymphedema: none MedOnc F/U: Dr. Al Pimple on 05-06-23 Other issues of note: No major concerns or questions at this time. Pt knows to call if any needs arise.   Pt reports Yes No Comments  Tamoxifen [x]  []  20mg   Letrozole []  [x]    Anastrazole []  [x]    Mammogram [x]  Date:  []  Encouraged pt to get yearly mammograms.

## 2023-02-18 ENCOUNTER — Ambulatory Visit
Admission: RE | Admit: 2023-02-18 | Discharge: 2023-02-18 | Disposition: A | Discharge status: Home / Self Care | Payer: BC Managed Care – PPO | Source: Ambulatory Visit | Attending: Radiation Oncology | Admitting: Radiation Oncology

## 2023-02-18 ENCOUNTER — Telehealth: Payer: Self-pay

## 2023-02-18 ENCOUNTER — Encounter: Payer: Self-pay | Admitting: Radiation Oncology

## 2023-02-18 NOTE — Telephone Encounter (Signed)
Rn called pt today for telephone follow up. Pt is doing well with no needs. Note completed and routed to Dr. Basilio Cairo.

## 2023-04-15 ENCOUNTER — Other Ambulatory Visit: Payer: Self-pay | Admitting: Hematology and Oncology

## 2023-05-05 ENCOUNTER — Ambulatory Visit: Payer: BC Managed Care – PPO | Admitting: Hematology and Oncology

## 2023-05-06 ENCOUNTER — Ambulatory Visit: Payer: BC Managed Care – PPO | Admitting: Hematology and Oncology

## 2023-05-06 ENCOUNTER — Other Ambulatory Visit: Payer: Self-pay

## 2023-05-06 ENCOUNTER — Inpatient Hospital Stay: Payer: BC Managed Care – PPO | Attending: Hematology and Oncology | Admitting: Adult Health

## 2023-05-06 ENCOUNTER — Encounter: Payer: Self-pay | Admitting: Adult Health

## 2023-05-06 VITALS — BP 137/83 | HR 118 | Temp 98.4°F | Resp 17 | Wt 198.6 lb

## 2023-05-06 DIAGNOSIS — C50111 Malignant neoplasm of central portion of right female breast: Secondary | ICD-10-CM | POA: Diagnosis present

## 2023-05-06 DIAGNOSIS — Z170 Estrogen receptor positive status [ER+]: Secondary | ICD-10-CM | POA: Insufficient documentation

## 2023-05-06 DIAGNOSIS — Z803 Family history of malignant neoplasm of breast: Secondary | ICD-10-CM | POA: Insufficient documentation

## 2023-05-06 DIAGNOSIS — Z800 Family history of malignant neoplasm of digestive organs: Secondary | ICD-10-CM | POA: Diagnosis not present

## 2023-05-06 DIAGNOSIS — Z923 Personal history of irradiation: Secondary | ICD-10-CM | POA: Insufficient documentation

## 2023-05-06 DIAGNOSIS — C50911 Malignant neoplasm of unspecified site of right female breast: Secondary | ICD-10-CM

## 2023-05-06 NOTE — Progress Notes (Signed)
SURVIVORSHIP VISIT:  BRIEF ONCOLOGIC HISTORY:  Oncology History  DCIS (ductal carcinoma in situ) of breast  07/14/2022 Mammogram   Diagnostic bilateral mammogram done on July 14, 2022 showed a broad area of calcifications in the lower inner quadrant of the right breast.  Within this area of calcifications that are similar to the surrounding calcifications and warrant biopsy.  No evidence of malignancy in the left breast.   08/18/2022 Initial Diagnosis   DCIS (ductal carcinoma in situ) of breast    Pathology Results   Pathology results showed intermediate nuclear grade DCIS in both sides sites Top-Hat clip and a Q clip.  Prognostic showed ER/PR positivity.   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, estrogen receptor positive (HCC)  08/31/2022 Genetic Testing   Negative.  Genes tested: APC*, ATM*, AXIN2, BARD1, BMPR1A, BRCA1*, BRCA2*, BRIP1*, CDH1*, CDK4, CDKN2A, CHEK2*, DICER1, MLH1*, MSH2*, MSH3, MSH6*, MUTYH*, NBN, NF1*, NTHL1, PALB2*, PMS2*, PTEN*, RAD51C*, RAD51D*, RECQL, SMAD4, SMARCA4, STK11 and TP53* (sequencing and deletion/duplication); HOXB13, POLD1 and POLE (sequencing only); EPCAM and GREM1 (deletion/duplication only).    09/16/2022 Surgery   Right breast lumpectomy: Focus of mixed invasive lobular and invasive ductal carcinoma, 0.6 cm,  grade 2. ER/PR +, HER2 -, ki-67 25%   10/07/2022 Surgery   Re-excision: Margins cleared, 5 SLN negative   12/04/2022 Cancer Staging   Staging form: Breast, AJCC 8th Edition - Pathologic stage from 12/04/2022: Stage IA (pT1b, pN0, cM0, G2, ER+, PR+, HER2-) - Signed by Lonie Peak, MD on 12/04/2022 Histologic grading system: 3 grade system   12/16/2022 - 01/13/2023 Radiation Therapy   Right breast radiation: 42.56 Gy in 16 treatments; "Boost": 10 Gy in 5 treatments    04/2023 -  Anti-estrogen oral therapy   Tamoxifen daily     INTERVAL HISTORY:  Mercedes Fisher to review her survivorship care plan detailing her treatment course for  breast cancer, as well as monitoring long-term side effects of that treatment, education regarding health maintenance, screening, and overall wellness and health promotion.     Overall, Mercedes Fisher reports feeling quite well.  She is taking tamoxifen daily and tolerates it quite well.  REVIEW OF SYSTEMS:  Review of Systems  Constitutional:  Negative for appetite change, chills, fatigue, fever and unexpected weight change.  HENT:   Negative for hearing loss, lump/mass and trouble swallowing.   Eyes:  Negative for eye problems and icterus.  Respiratory:  Negative for chest tightness, cough and shortness of breath.   Cardiovascular:  Negative for chest pain, leg swelling and palpitations.  Gastrointestinal:  Negative for abdominal distention, abdominal pain, constipation, diarrhea, nausea and vomiting.  Endocrine: Negative for hot flashes.  Genitourinary:  Negative for difficulty urinating.   Musculoskeletal:  Negative for arthralgias.  Skin:  Negative for itching and rash.  Neurological:  Negative for dizziness, extremity weakness, headaches and numbness.  Hematological:  Negative for adenopathy. Does not bruise/bleed easily.  Psychiatric/Behavioral:  Negative for depression. The patient is not nervous/anxious.    Breast: Denies any new nodularity, masses, tenderness, nipple changes, or nipple discharge.       PAST MEDICAL/SURGICAL HISTORY:  Past Medical History:  Diagnosis Date   Anemia    took Iron supplements    Cancer (HCC)    breast cancer   Family history of adverse reaction to anesthesia    mom hard to wake up after anesthesia   Past Surgical History:  Procedure Laterality Date   BREAST BIOPSY  09/15/2022   MM RT RADIOACTIVE  SEED LOC MAMMO GUIDE 09/15/2022 [facility]   BREAST BIOPSY  09/15/2022   MM RT RADIOACTIVE SEED EA ADD LESION LOC MAMMO GUIDE 09/15/2022 [facility]   BREAST BIOPSY  09/15/2022   MM RT RADIOACTIVE SEED EA ADD LESION LOC MAMMO GUIDE 09/15/2022  [facility]   BREAST BIOPSY  09/15/2022   MM RT RADIOACTIVE SEED EA ADD LESION LOC MAMMO GUIDE 09/15/2022 [facility]   BREAST LUMPECTOMY WITH RADIOACTIVE SEED LOCALIZATION Right 09/16/2022   Procedure: RIGHT BREAST BRACKETED LUMPECTOMY WITH RADIOACTIVE SEED LOCALIZATION;  Surgeon: Harriette Bouillon, MD;  Location: [facility];  Service: General;  Laterality: Right;   LAPAROSCOPIC BILATERAL SALPINGECTOMY Bilateral 06/14/2015   Procedure: LAPAROSCOPIC BILATERAL SALPINGECTOMY;  Surgeon: Maxie Better, MD;  Location: [facility];  Service: Gynecology;  Laterality: Bilateral;   RE-EXCISION OF BREAST LUMPECTOMY Right 10/07/2022   Procedure: RE-EXCISION OF RIGHT BREAST LUMPECTOMY;  Surgeon: Harriette Bouillon, MD;  Location: [facility];  Service: General;  Laterality: Right;   ROBOTIC ASSISTED TOTAL HYSTERECTOMY N/A 06/14/2015   Procedure: ROBOTIC ASSISTED TOTAL HYSTERECTOMY With Morcellation;  Surgeon: Maxie Better, MD;  Location: [facility];  Service: Gynecology;  Laterality: N/A;   SENTINEL NODE BIOPSY Right 10/07/2022   Procedure: RIGHT SENTINEL NODE BIOPSY;  Surgeon: Harriette Bouillon, MD;  Location: [facility];  Service: General;  Laterality: Right;     ALLERGIES:  Allergies  Allergen Reactions   Chlorhexidine Rash   Latex Rash     CURRENT MEDICATIONS:  Outpatient Encounter Medications as of 05/06/2023  Medication Sig   ascorbic acid (VITAMIN C) 100 MG tablet Take 100 mg by mouth daily.   B Complex Vitamins (VITAMIN B COMPLEX PO) Take 1 capsule by mouth daily.   ELDERBERRY PO Take 1 capsule by mouth daily.   ERGOCALCIFEROL PO Take 1 capsule by mouth daily.   MAGNESIUM OXIDE, ELEMENTAL, PO Take 1 tablet by mouth daily.   Multiple Vitamin (MULTIVITAMIN WITH MINERALS) TABS tablet Take 1 tablet by mouth daily.   tamoxifen (NOLVADEX) 20 MG tablet TAKE 1 TABLET(20 MG) BY MOUTH DAILY   acetaminophen (TYLENOL) 500 MG tablet Take 500 mg by mouth every 6 (six) hours as needed for mild pain.  (Patient not taking: Reported on 05/06/2023)   ibuprofen (ADVIL) 800 MG tablet Take 1 tablet (800 mg total) by mouth every 8 (eight) hours as needed. (Patient not taking: Reported on 05/06/2023)   oxyCODONE (OXY IR/ROXICODONE) 5 MG immediate release tablet Take 1 tablet (5 mg total) by mouth every 6 (six) hours as needed for severe pain.   No facility-administered encounter medications on file as of 05/06/2023.     ONCOLOGIC FAMILY HISTORY:  Family History  Problem Relation Age of Onset   Pancreatic cancer Paternal Aunt    Breast cancer Paternal Aunt        dx 21s   Breast cancer Paternal Grandmother        dx 25s     SOCIAL HISTORY:  Social History   Socioeconomic History   Marital status: Married    Spouse name: Not on file   Number of children: Not on file   Years of education: Not on file   Highest education level: Not on file  Occupational History   Not on file  Tobacco Use   Smoking status: Never   Smokeless tobacco: Never  Vaping Use   Vaping status: Never Used  Substance and Sexual Activity   Alcohol use: No   Drug use: No   Sexual activity: Yes  Birth control/protection: Surgical  Other Topics Concern   Not on file  Social History Narrative   Not on file   Social Determinants of Health   Financial Resource Strain: Not on file  Food Insecurity: No Food Insecurity (12/04/2022)   Hunger Vital Sign    Worried About Running Out of Food in the Last Year: Never true    Ran Out of Food in the Last Year: Never true  Transportation Needs: No Transportation Needs (12/04/2022)   PRAPARE - Administrator, Civil Service (Medical): No    Lack of Transportation (Non-Medical): No  Physical Activity: Not on file  Stress: Not on file  Social Connections: Not on file  Intimate Partner Violence: Not At Risk (12/04/2022)   Humiliation, Afraid, Rape, and Kick questionnaire    Fear of Current or Ex-Partner: No    Emotionally Abused: No    Physically Abused: No     Sexually Abused: No     OBSERVATIONS/OBJECTIVE:  BP 137/83   Pulse (!) 118 Comment: Np notified  Temp 98.4 F (36.9 C) (Oral)   Resp 17   Wt 198 lb 9.6 oz (90.1 kg)   LMP  (LMP Unknown)   SpO2 99%   BMI 31.11 kg/m  GENERAL: Patient is a well appearing female in no acute distress HEENT:  Sclerae anicteric.  Oropharynx clear and moist. No ulcerations or evidence of oropharyngeal candidiasis. Neck is supple.  NODES:  No cervical, supraclavicular, or axillary lymphadenopathy palpated.  BREAST EXAM: Right breast status post lumpectomy and radiation no sign of local recurrence left breast is benign LUNGS:  Clear to auscultation bilaterally.  No wheezes or rhonchi. Fisher:  Regular rate and rhythm. No murmur appreciated. ABDOMEN:  Soft, nontender.  Positive, normoactive bowel sounds. No organomegaly palpated. MSK:  No focal spinal tenderness to palpation. Full range of motion bilaterally in the upper extremities. EXTREMITIES:  No peripheral edema.   SKIN:  Clear with no obvious rashes or skin changes. No nail dyscrasia. NEURO:  Nonfocal. Well oriented.  Appropriate affect.   LABORATORY DATA:  None for this visit.  DIAGNOSTIC IMAGING:  None for this visit.      ASSESSMENT AND PLAN:  Ms.. Fisher is a pleasant 52 y.o. 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  She presents to the Survivorship Clinic for our initial meeting and routine follow-up post-completion of treatment for breast cancer.    1. Stage IA right breast cancer:  Mercedes Fisher is continuing to recover from definitive treatment for breast cancer. She will follow-up with her medical oncologist, Dr.  Al Pimple in 6 months with history and physical exam per surveillance protocol.  She will continue her anti-estrogen therapy with Tamoxifen. Thus far,  she is tolerating the Tamoxifen well, with minimal side effects. Her mammogram is due 08/2023; orders placed today.   Today, a comprehensive survivorship care plan and treatment summary was reviewed with the patient today detailing her breast cancer diagnosis, treatment course, potential late/long-term effects of treatment, appropriate follow-up care with recommendations for the future, and patient education resources.  A copy of this summary, along with a letter will be sent to the patient's primary care provider via mail/fax/In Basket message after today's visit.    2. Bone health:   She was given education on specific activities to promote bone health.  3. Cancer screening:  Due to  Mercedes Fisher history and her age, she should receive screening for skin cancers, colon cancer, and gynecologic cancers.  The information and recommendations are listed on the patient's comprehensive care plan/treatment summary and were reviewed in detail with the patient.    4. Health maintenance and wellness promotion: Mercedes Fisher was encouraged to consume 5-7 servings of fruits and vegetables per day. We reviewed the "Nutrition Rainbow" handout.  She was also encouraged to engage in moderate to vigorous exercise for 30 minutes per day most days of the week.  She was instructed to limit her alcohol consumption and continue to abstain from tobacco use.     5. Support services/counseling: It is not uncommon for this period of the patient's cancer care trajectory to be one of many emotions and stressors.   She was given information regarding our available services and encouraged to contact me with any questions or for help enrolling in any of our support group/programs.    Follow up instructions:    -Return to cancer center in 6 months for f/u with Dr. Al Pimple  -Mammogram due in 08/2023 -She is welcome to return back to the Survivorship Clinic at any time; no additional follow-up needed at this time.  -Consider referral back  to survivorship as a long-term survivor for continued surveillance  The patient was provided an opportunity to ask questions and all were answered. The patient agreed with the plan and demonstrated an understanding of the instructions.   Total encounter time:45 minutes*in face-to-face visit time, chart review, lab review, care coordination, order entry, and documentation of the encounter time.    Lillard Anes, NP 05/06/23 1:31 PM Medical Oncology and Hematology [facility] [address] [place], [place] [address] Tel. [phone]    Fax. [phone]  *Total Encounter Time as defined by the Centers for Medicare and Medicaid Services includes, in addition to the face-to-face time of a patient visit (documented in the note above) non-face-to-face time: obtaining and reviewing outside history, ordering and reviewing medications, tests or procedures, care coordination (communications with other health care professionals or caregivers) and documentation in the medical record.

## 2023-05-12 ENCOUNTER — Telehealth: Payer: Self-pay | Admitting: Adult Health

## 2023-05-12 NOTE — Telephone Encounter (Signed)
Left patient a message in regards to scheduled appointment times/date

## 2023-08-02 ENCOUNTER — Other Ambulatory Visit: Payer: Self-pay | Admitting: *Deleted

## 2023-08-02 MED ORDER — TAMOXIFEN CITRATE 20 MG PO TABS: 20.0000 mg | ORAL_TABLET | Freq: Every day | ORAL | 2 refills | Status: DC

## 2023-10-18 ENCOUNTER — Telehealth: Payer: Self-pay | Admitting: Hematology and Oncology

## 2023-10-18 NOTE — Telephone Encounter (Signed)
 Spoke with patient confirming upcoming appointment

## 2023-10-25 ENCOUNTER — Ambulatory Visit: Payer: BC Managed Care – PPO | Admitting: Hematology and Oncology

## 2023-10-26 ENCOUNTER — Telehealth: Payer: Self-pay

## 2023-10-26 NOTE — Telephone Encounter (Signed)
Spoke with patient to confirm appointment with Dr Al Pimple.  Patient confirmed and understood.

## 2023-10-27 ENCOUNTER — Inpatient Hospital Stay: Payer: 59 | Attending: Hematology and Oncology | Admitting: Hematology and Oncology

## 2023-10-27 ENCOUNTER — Encounter: Payer: Self-pay | Admitting: Hematology and Oncology

## 2023-10-27 VITALS — BP 148/80 | HR 99 | Temp 98.1°F | Resp 16 | Wt 210.4 lb

## 2023-10-27 DIAGNOSIS — C50911 Malignant neoplasm of unspecified site of right female breast: Secondary | ICD-10-CM | POA: Diagnosis not present

## 2023-10-27 DIAGNOSIS — Z170 Estrogen receptor positive status [ER+]: Secondary | ICD-10-CM | POA: Diagnosis not present

## 2023-10-27 DIAGNOSIS — Z79810 Long term (current) use of selective estrogen receptor modulators (SERMs): Secondary | ICD-10-CM | POA: Diagnosis not present

## 2023-10-27 DIAGNOSIS — C50111 Malignant neoplasm of central portion of right female breast: Secondary | ICD-10-CM | POA: Insufficient documentation

## 2023-10-27 DIAGNOSIS — C50011 Malignant neoplasm of nipple and areola, right female breast: Secondary | ICD-10-CM | POA: Diagnosis present

## 2023-10-27 DIAGNOSIS — Z803 Family history of malignant neoplasm of breast: Secondary | ICD-10-CM | POA: Diagnosis not present

## 2023-10-27 DIAGNOSIS — Z1732 Human epidermal growth factor receptor 2 negative status: Secondary | ICD-10-CM | POA: Diagnosis not present

## 2023-10-27 DIAGNOSIS — Z923 Personal history of irradiation: Secondary | ICD-10-CM | POA: Diagnosis not present

## 2023-10-27 DIAGNOSIS — Z1721 Progesterone receptor positive status: Secondary | ICD-10-CM | POA: Insufficient documentation

## 2023-10-27 MED ORDER — TAMOXIFEN CITRATE 20 MG PO TABS: 20.0000 mg | ORAL_TABLET | Freq: Every day | ORAL | 3 refills | Status: DC

## 2023-10-27 NOTE — Progress Notes (Signed)
[facility] CONSULT NOTE  Patient Care Team: Iona Hansen, NP as PCP - General (Nurse Practitioner) Rachel Moulds, MD as Consulting Physician (Hematology and Oncology) Lonie Peak, MD as Attending Physician (Radiation Oncology) Harriette Bouillon, MD as Consulting Physician (General Surgery)  CHIEF COMPLAINTS/PURPOSE OF CONSULTATION:   Breast cancer follow up  Assessment and plan  This is a very pleasant 53 year old female patient with newly diagnosed right breast mixed invasive lobular and ductal carcinoma measuring 6 mm, grade 2 had reexcision because of close margins, prognostics showed ER 95% strong staining PR 5% strong staining Ki-67 of 25% and HER2 1+ who is here to discuss Oncotype results.  Sentinel lymph nodes negative for malignancy.  Oncotype DX resulted at 29, her risk of recurrence of breast cancer at 9 years is approximately 18%, there is benefit of chemotherapy demonstrated in this group of patients.   Re-excised margin positive, pt refused further surgery. She completed adj radiation and is now on adj tamoxifen. She is tolerating tamoxifen extremely well. No adverse effects except hot flashes. -Continue Tamoxifen, DOT: 5 yrs. -Encouraged to stay hydrated to manage hot flashes.  Follow-up Mammogram and Ultrasound Due in 3 months, to be scheduled by the patient. -Advise patient to schedule mammogram and ultrasound.  General Health Maintenance Recent labs from another provider were reviewed and found to be normal. -Continue current management.  Potential Tamoxifen Side Effects Discussed the risk of blood clots and symptoms to watch for. -Instructed patient to hold Tamoxifen and call the office if one leg becomes much more swollen than the other or if sudden onset chest pain or shortness of breath occurs.  Follow-up in 6 months.  HISTORY OF PRESENTING ILLNESS:  Mercedes Fisher 53 y.o. female is here because of recent diagnosis of right breast IDC and  ILC  I reviewed her records extensively and collaborated the history with the patient.  SUMMARY OF ONCOLOGIC HISTORY: Oncology History  DCIS (ductal carcinoma in situ) of breast  07/14/2022 Mammogram   Diagnostic bilateral mammogram done on July 14, 2022 showed a broad area of calcifications in the lower inner quadrant of the right breast.  Within this area of calcifications that are similar to the surrounding calcifications and warrant biopsy.  No evidence of malignancy in the left breast.   08/18/2022 Initial Diagnosis   DCIS (ductal carcinoma in situ) of breast    Pathology Results   Pathology results showed intermediate nuclear grade DCIS in both sides sites Top-Hat clip and a Q clip.  Prognostic showed ER/PR positivity.   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, estrogen receptor positive (HCC)  08/31/2022 Genetic Testing   Negative.  Genes tested: APC*, ATM*, AXIN2, BARD1, BMPR1A, BRCA1*, BRCA2*, BRIP1*, CDH1*, CDK4, CDKN2A, CHEK2*, DICER1, MLH1*, MSH2*, MSH3, MSH6*, MUTYH*, NBN, NF1*, NTHL1, PALB2*, PMS2*, PTEN*, RAD51C*, RAD51D*, RECQL, SMAD4, SMARCA4, STK11 and TP53* (sequencing and deletion/duplication); HOXB13, POLD1 and POLE (sequencing only); EPCAM and GREM1 (deletion/duplication only).    09/16/2022 Surgery   Right breast lumpectomy: Focus of mixed invasive lobular and invasive ductal carcinoma, 0.6 cm,  grade 2. ER/PR +, HER2 -, ki-67 25%   10/07/2022 Surgery   Re-excision: Margins cleared, 5 SLN negative   12/04/2022 Cancer Staging   Staging form: Breast, AJCC 8th Edition - Pathologic stage from 12/04/2022: Stage IA (pT1b, pN0, cM0, G2, ER+, PR+, HER2-) - Signed by Lonie Peak, MD on 12/04/2022 Histologic grading system: 3 grade system   12/16/2022 - 01/13/2023 Radiation Therapy   Right breast  radiation: 42.56 Gy in 16 treatments; "Boost": 10 Gy in 5 treatments    04/2023 -  Anti-estrogen oral therapy   Tamoxifen daily    Discussed the use of AI scribe  software for clinical note transcription with the patient, who gave verbal consent to proceed.  History of Present Illness    Mercedes Fisher is a 53 year old female with breast cancer who presents for follow-up regarding her treatment and symptoms. She is accompanied by her daughter.  She is currently undergoing treatment with tamoxifen for breast cancer. She experiences hot flashes as a side effect but finds them manageable. No leg swelling or other side effects aside from hot flashes are reported.  A mammogram was conducted in December, with a follow-up scheduled in three months. Initial findings were not concerning but require confirmation. She is also due for a follow-up ultrasound.  She has undergone a partial hysterectomy but retains her ovaries. She is not menstruating and has not experienced any other side effects from tamoxifen related to her reproductive system.  Her last laboratory tests were satisfactory, and she is maintaining good health. She is employed in [employer] account [employer] and enjoys her work. She takes vitamins and supplements like CMOS for immune support.  MEDICAL HISTORY:  Past Medical History:  Diagnosis Date   Anemia    took Iron supplements    Cancer (HCC)    breast cancer   Family history of adverse reaction to anesthesia    mom hard to wake up after anesthesia    SURGICAL HISTORY: Past Surgical History:  Procedure Laterality Date   BREAST BIOPSY  09/15/2022   MM RT RADIOACTIVE SEED LOC MAMMO GUIDE 09/15/2022 [facility]   BREAST BIOPSY  09/15/2022   MM RT RADIOACTIVE SEED EA ADD LESION LOC MAMMO GUIDE 09/15/2022 [facility]   BREAST BIOPSY  09/15/2022   MM RT RADIOACTIVE SEED EA ADD LESION LOC MAMMO GUIDE 09/15/2022 [facility]   BREAST BIOPSY  09/15/2022   MM RT RADIOACTIVE SEED EA ADD LESION LOC MAMMO GUIDE 09/15/2022 [facility]   BREAST LUMPECTOMY WITH RADIOACTIVE SEED LOCALIZATION Right 09/16/2022   Procedure: RIGHT BREAST BRACKETED LUMPECTOMY WITH  RADIOACTIVE SEED LOCALIZATION;  Surgeon: Harriette Bouillon, MD;  Location: [facility];  Service: General;  Laterality: Right;   LAPAROSCOPIC BILATERAL SALPINGECTOMY Bilateral 06/14/2015   Procedure: LAPAROSCOPIC BILATERAL SALPINGECTOMY;  Surgeon: Maxie Better, MD;  Location: [facility];  Service: Gynecology;  Laterality: Bilateral;   RE-EXCISION OF BREAST LUMPECTOMY Right 10/07/2022   Procedure: RE-EXCISION OF RIGHT BREAST LUMPECTOMY;  Surgeon: Harriette Bouillon, MD;  Location: [facility];  Service: General;  Laterality: Right;   ROBOTIC ASSISTED TOTAL HYSTERECTOMY N/A 06/14/2015   Procedure: ROBOTIC ASSISTED TOTAL HYSTERECTOMY With Morcellation;  Surgeon: Maxie Better, MD;  Location: [facility];  Service: Gynecology;  Laterality: N/A;   SENTINEL NODE BIOPSY Right 10/07/2022   Procedure: RIGHT SENTINEL NODE BIOPSY;  Surgeon: Harriette Bouillon, MD;  Location: [facility];  Service: General;  Laterality: Right;    SOCIAL HISTORY: Social History   Socioeconomic History   Marital status: Married    Spouse name: Not on file   Number of children: Not on file   Years of education: Not on file   Highest education level: Not on file  Occupational History   Not on file  Tobacco Use   Smoking status: Never   Smokeless tobacco: Never  Vaping Use   Vaping status: Never Used  Substance and Sexual Activity   Alcohol use: No  Drug use: No   Sexual activity: Yes    Birth control/protection: Surgical  Other Topics Concern   Not on file  Social History Narrative   Not on file   Social Drivers of Health   Financial Resource Strain: Not on file  Food Insecurity: Low Risk  (06/02/2023)   Received from [facility]   Hunger Vital Sign    Worried About Running Out of Food in the Last Year: Never true    Ran Out of Food in the Last Year: Never true  Transportation Needs: No Transportation Needs (06/02/2023)   Received from [facility]    In the past 12 months, has lack of  reliable transportation kept you from medical appointments, meetings, work or from getting things needed for daily living? : No  Physical Activity: Not on file  Stress: Not on file  Social Connections: Not on file  Intimate Partner Violence: Not At Risk (12/04/2022)   Humiliation, Afraid, Rape, and Kick questionnaire    Fear of Current or Ex-Partner: No    Emotionally Abused: No    Physically Abused: No    Sexually Abused: No    FAMILY HISTORY: Family History  Problem Relation Age of Onset   Pancreatic cancer Paternal Aunt    Breast cancer Paternal Aunt        dx 49s   Breast cancer Paternal Grandmother        dx 55s    ALLERGIES:  is allergic to chlorhexidine and latex.  MEDICATIONS:  Current Outpatient Medications  Medication Sig Dispense Refill   acetaminophen (TYLENOL) 500 MG tablet Take 500 mg by mouth every 6 (six) hours as needed for mild pain. (Patient not taking: Reported on 05/06/2023)     ascorbic acid (VITAMIN C) 100 MG tablet Take 100 mg by mouth daily.     B Complex Vitamins (VITAMIN B COMPLEX PO) Take 1 capsule by mouth daily.     ELDERBERRY PO Take 1 capsule by mouth daily.     ERGOCALCIFEROL PO Take 1 capsule by mouth daily.     ibuprofen (ADVIL) 800 MG tablet Take 1 tablet (800 mg total) by mouth every 8 (eight) hours as needed. (Patient not taking: Reported on 05/06/2023) 30 tablet 0   MAGNESIUM OXIDE, ELEMENTAL, PO Take 1 tablet by mouth daily.     Multiple Vitamin (MULTIVITAMIN WITH MINERALS) TABS tablet Take 1 tablet by mouth daily.     tamoxifen (NOLVADEX) 20 MG tablet Take 1 tablet (20 mg total) by mouth daily. 90 tablet 3   No current facility-administered medications for this visit.    REVIEW OF SYSTEMS:   Constitutional: Denies fevers, chills or abnormal night sweats Eyes: Denies blurriness of vision, double vision or watery eyes Ears, nose, mouth, throat, and face: Denies mucositis or sore throat Respiratory: Denies cough, dyspnea or  wheezes Cardiovascular: Denies palpitation, chest discomfort or lower extremity swelling Gastrointestinal:  Denies nausea, heartburn or change in bowel habits Skin: Denies abnormal skin rashes Lymphatics: Denies new lymphadenopathy or easy bruising Neurological:Denies numbness, tingling or new weaknesses Behavioral/Psych: Mood is stable, no new changes  Breast: Denies any palpable lumps or discharge All other systems were reviewed with the patient and are negative.  PHYSICAL EXAMINATION: ECOG PERFORMANCE STATUS: 0 - Asymptomatic  She appears alert, no distress Bilateral breasts inspected. No palpable masses or regional adenopathy CTA bilaterally Heart: RRR No LE edema.  LABORATORY DATA:  I have reviewed the data as listed Lab Results  Component  Value Date   WBC 5.5 10/07/2022   HGB 14.2 10/07/2022   HCT 42.0 10/07/2022   MCV 88.2 10/07/2022   PLT 305 10/07/2022   Lab Results  Component Value Date   NA 137 06/15/2015   K 3.5 06/15/2015   CL 105 06/15/2015   CO2 25 06/15/2015    All questions were answered. The patient knows to call the clinic with any problems, questions or concerns.    Rachel Moulds, MD 10/27/23

## 2023-12-21 ENCOUNTER — Other Ambulatory Visit: Admitting: Licensed Clinical Social Worker

## 2023-12-24 ENCOUNTER — Inpatient Hospital Stay: Attending: Hematology and Oncology | Admitting: Licensed Clinical Social Worker

## 2023-12-24 NOTE — Progress Notes (Signed)
 [facility] CSW Counseling Note  Patient was referred by self. Treatment type: Individual   Presenting Concerns: Patient and/or family reports the following symptoms/concerns: adjustment in survivorship Duration of problem: months; Severity of problem: mild   Orientation:oriented to person, place, time/date, and situation.   Affect: Appropriate and Congruent Risk of harm to self or others: No plan to harm self or others  Patient and/or Family's Strengths/Protective Factors: Social connections, Social and Emotional competence, and Concrete supports in place (healthy food, safe environments, etc.)Ability for insight  Capable of independent living  Communication skills  Motivation for treatment/growth      Goals Addressed: Patient will:  Reduce symptoms of: stress Increase healthy adjustment to current life circumstances   Progress towards Goals: Initial   Interventions: Interventions utilized:  CBT and Strength-based      Assessment: Patient returns to counseling for support in processing and managing stress in survivorship.  She recently had a mammogram and had high anxiety that day and is dealing with other typical emotions and worries in survivorship in addition to other life stressors (mom had a stroke).  Pt's home environment is improved significantly since last visit.    CSW normalized feelings and discussed ways to help manage.  Pt is working on noticing her negative thoughts and accepting and letting go rather than fighting them. CSW provided psychoeducation on physiological responses and protection against perceived dangers.  Utilized CBT to build on the difference between thoughts and facts.      Plan: Follow up with CSW: 2 weeks Behavioral recommendations: continue positive coping skills and outlook. Remember that thoughts are not facts and then remind yourself of alternative possibilities. Referral(s): Support group(s):  breast group , tai chi, yoga, peer mentor, Reiki.        Jaryn Rosko E Levonne Carreras, LCSW

## 2024-01-06 ENCOUNTER — Inpatient Hospital Stay: Admitting: Licensed Clinical Social Worker

## 2024-01-06 NOTE — Progress Notes (Signed)
 [facility] CSW Counseling Note  Patient was referred by self. Treatment type: Individual   Presenting Concerns: Patient and/or family reports the following symptoms/concerns: adjustment in survivorship Duration of problem: months; Severity of problem: mild   Orientation:oriented to person, place, time/date, and situation.   Affect: Appropriate and Congruent Risk of harm to self or others: No plan to harm self or others  Patient and/or Family's Strengths/Protective Factors: Social connections, Social and Emotional competence, and Concrete supports in place (healthy food, safe environments, etc.)Ability for insight  Capable of independent living  Communication skills  Motivation for treatment/growth      Goals Addressed: Patient will:  Reduce symptoms of: stress Increase healthy adjustment to current life circumstances   Progress towards Goals: Progressing   Interventions: Interventions utilized:  CBT and Strength-based      Assessment: Patient reports doing well the last 2 weeks, mainly adjusting to fatigue after being very active. She has utilized the "thoughts are not facts" mantra to work towards non-judgmental acceptance and then release of unhelpful thoughts. Discussion today on boundary setting and the changes in relationship dynamics (family, friend, partner) that pt has experienced with these more solid boundaries. Pt is no longer burning herself out to help others and has found a balance of being present without overgiving.   Spent time today discussing body focus and noticing more feelings in her arm/breast. CSW provided education on body scan and progressive muscle relaxation to help notice and release tension and move focus away from one area.     Plan: Follow up with CSW: 3 weeks Behavioral recommendations: continue positive coping skills and outlook. Remember that thoughts are not facts. Continue setting healthy boundaries and maintaining them as a level of respect for  yourself. Add in PMR or body scan as needed if too focused on one body area  Referral(s): Support group(s):  breast group , tai chi, yoga, peer mentor, Reiki.       Mercedes Fisher Mercedes Kadel, LCSW

## 2024-01-25 ENCOUNTER — Inpatient Hospital Stay: Attending: Hematology and Oncology | Admitting: Licensed Clinical Social Worker

## 2024-02-03 ENCOUNTER — Inpatient Hospital Stay: Admitting: Licensed Clinical Social Worker

## 2024-02-03 NOTE — Progress Notes (Signed)
 [facility] CSW Counseling Note  Patient was referred by self. Treatment type: Individual   Presenting Concerns: Patient and/or family reports the following symptoms/concerns: adjustment in survivorship Duration of problem: months; Severity of problem: mild   Orientation:oriented to person, place, time/date, and situation.   Affect: Appropriate and Congruent Risk of harm to self or others: No plan to harm self or others  Patient and/or Family's Strengths/Protective Factors: Social connections, Social and Emotional competence, and Concrete supports in place (healthy food, safe environments, etc.)Ability for insight  Capable of independent living  Communication skills  Motivation for treatment/growth      Goals Addressed: Patient will:  Reduce symptoms of: stress Increase healthy adjustment to current life circumstances   Progress towards Goals: Progressing   Interventions: Interventions utilized:  CBT and Other: psychoeducation on advance directives      Assessment: Patient processed a few life stressors today, including a friend's mom passing and change-over in people at work (happens every 2-3 years due to active [employer]).  The death brought to the forefront wanting to be proactive with her own wishes as well as for her family, especially with mom's stroke. Education provided on advance directive paperwork for healthcare, what is involved, and how to complete it as well as how to start conversations with family.  Regarding work stress, pt recognized her thinking about the change in people and how it brings mixed emotions of grief, relief, nervousness and the logistic changes involved. She is able to utilize CBT skills and relaxation strategies to maintain positive mental wellbeing. She is also applying this to bodily sensations as well.     Plan: Follow up with CSW: 1 month Behavioral recommendations: look at AD paperwork and schedule to complete during AD clinic. Continue utilizing  coping skills Referral(s): Support group(s):  breast group, tai chi, yoga, peer mentor, Reiki.       Leocadia Idleman E Jah Alarid, LCSW

## 2024-03-06 ENCOUNTER — Inpatient Hospital Stay: Attending: Hematology and Oncology | Admitting: Licensed Clinical Social Worker

## 2024-03-16 ENCOUNTER — Inpatient Hospital Stay: Attending: Hematology and Oncology | Admitting: Licensed Clinical Social Worker

## 2024-03-16 NOTE — Progress Notes (Signed)
 [facility] CSW Counseling Note  Patient was referred by self. Treatment type: Individual   Presenting Concerns: Patient and/or family reports the following symptoms/concerns: adjustment in survivorship Duration of problem: months; Severity of problem: mild   Orientation:oriented to person, place, time/date, and situation.   Affect: Appropriate and Congruent Risk of harm to self or others: No plan to harm self or others  Patient and/or Family's Strengths/Protective Factors: Social connections, Social and Emotional competence, and Concrete supports in place (healthy food, safe environments, etc.)Ability for insight  Capable of independent living  Communication skills  Motivation for treatment/growth      Goals Addressed: Patient will:  Reduce symptoms of: stress Increase healthy adjustment to current life circumstances   Progress towards Goals: Met   Interventions: Interventions utilized:  Other: Maintenance planning      Assessment: Patient is doing well. She returned from a trip to [place]  which was relaxing and fun. Despite general life stressors (work turnover with staff, discussions at home about the kids and goals for moving out, etc), pt continues to maintain healthy emotional boundaries. She is utilizing healthy thinking skills and coping skills (breathing). This has helped also with reducing fear of recurrence and ability to handle thoughts around that normal concern.  CSW and pt reviewed what she has learned and uses to maintain emotional wellbeing.       Plan: Follow up with CSW: PRN Behavioral recommendations: Continue utilizing thinking and coping skills to set healthy boundaries and assist in maintaining wellbeing. Call as needed for additional support Referral(s): Support group(s):  breast group, tai chi, yoga, peer mentor, Reiki.       Mercedes Fisher E Mercedes Buntrock, LCSW

## 2024-04-26 ENCOUNTER — Ambulatory Visit: Payer: 59 | Admitting: Hematology and Oncology

## 2024-05-05 ENCOUNTER — Inpatient Hospital Stay: Payer: Self-pay | Admitting: Adult Health

## 2024-05-10 ENCOUNTER — Inpatient Hospital Stay: Admitting: Adult Health

## 2024-05-18 ENCOUNTER — Encounter: Payer: Self-pay | Admitting: Adult Health

## 2024-05-18 ENCOUNTER — Inpatient Hospital Stay: Attending: Hematology and Oncology | Admitting: Adult Health

## 2024-05-18 VITALS — BP 140/81 | HR 106 | Temp 98.0°F | Resp 17 | Wt 209.9 lb

## 2024-05-18 DIAGNOSIS — Z923 Personal history of irradiation: Secondary | ICD-10-CM | POA: Diagnosis not present

## 2024-05-18 DIAGNOSIS — Z1721 Progesterone receptor positive status: Secondary | ICD-10-CM | POA: Insufficient documentation

## 2024-05-18 DIAGNOSIS — Z1732 Human epidermal growth factor receptor 2 negative status: Secondary | ICD-10-CM | POA: Insufficient documentation

## 2024-05-18 DIAGNOSIS — Z803 Family history of malignant neoplasm of breast: Secondary | ICD-10-CM | POA: Insufficient documentation

## 2024-05-18 DIAGNOSIS — C50011 Malignant neoplasm of nipple and areola, right female breast: Secondary | ICD-10-CM | POA: Diagnosis present

## 2024-05-18 DIAGNOSIS — C50111 Malignant neoplasm of central portion of right female breast: Secondary | ICD-10-CM | POA: Diagnosis not present

## 2024-05-18 DIAGNOSIS — Z800 Family history of malignant neoplasm of digestive organs: Secondary | ICD-10-CM | POA: Insufficient documentation

## 2024-05-18 DIAGNOSIS — D0510 Intraductal carcinoma in situ of unspecified breast: Secondary | ICD-10-CM | POA: Diagnosis not present

## 2024-05-18 DIAGNOSIS — Z170 Estrogen receptor positive status [ER+]: Secondary | ICD-10-CM | POA: Insufficient documentation

## 2024-05-18 NOTE — Progress Notes (Signed)
 SURVIVORSHIP VISIT:  BRIEF ONCOLOGIC HISTORY:  Oncology History  DCIS (ductal carcinoma in situ) of breast  07/14/2022 Mammogram   Diagnostic bilateral mammogram done on July 14, 2022 showed a broad area of calcifications in the lower inner quadrant of the right breast.  Within this area of calcifications that are similar to the surrounding calcifications and warrant biopsy.  No evidence of malignancy in the left breast.   08/18/2022 Initial Diagnosis   DCIS (ductal carcinoma in situ) of breast    Pathology Results   Pathology results showed intermediate nuclear grade DCIS in both sides sites Top-Hat clip and a Q clip.  Prognostic showed ER/PR positivity.   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, estrogen receptor positive (HCC)  08/31/2022 Genetic Testing   Negative.  Genes tested: APC*, ATM*, AXIN2, BARD1, BMPR1A, BRCA1*, BRCA2*, BRIP1*, CDH1*, CDK4, CDKN2A, CHEK2*, DICER1, MLH1*, MSH2*, MSH3, MSH6*, MUTYH*, NBN, NF1*, NTHL1, PALB2*, PMS2*, PTEN*, RAD51C*, RAD51D*, RECQL, SMAD4, SMARCA4, STK11 and TP53* (sequencing and deletion/duplication); HOXB13, POLD1 and POLE (sequencing only); EPCAM and GREM1 (deletion/duplication only).    09/16/2022 Surgery   Right breast lumpectomy: Focus of mixed invasive lobular and invasive ductal carcinoma, 0.6 cm,  grade 2. ER/PR +, HER2 -, ki-67 25%   10/07/2022 Surgery   Re-excision: Margins cleared, 5 SLN negative   12/04/2022 Cancer Staging   Staging form: Breast, AJCC 8th Edition - Pathologic stage from 12/04/2022: Stage IA (pT1b, pN0, cM0, G2, ER+, PR+, HER2-) - Signed by Izell Domino, MD on 12/04/2022 Histologic grading system: 3 grade system   12/16/2022 - 01/13/2023 Radiation Therapy   Right breast radiation: 42.56 Gy in 16 treatments; "Boost": 10 Gy in 5 treatments    04/2023 -  Anti-estrogen oral therapy   Tamoxifen  daily     INTERVAL HISTORY:  Ms. Privitera to review her survivorship care plan detailing her treatment course for  breast cancer, as well as monitoring long-term side effects of that treatment, education regarding health maintenance, screening, and overall wellness and health promotion.     Overall, Ms. Dugo reports feeling quite well.  Smriti is here today for f/u of her breast cancer.  She continues on Tamoxifen  daily.  She tolerates it moderately well.  She underwent a bilateral breast mammogram in December 2024 that demonstrated upper outer right breast 2 cm ill-defined hypoechoic area consistent with fat necrosis with 67-month follow-up recommended and a 1.3 cm near anechoic area likely surgical changes given history of reexcision towards this area no new or suspicious mammographic findings in the left breast and breast density category B.  The repeat right breast mammogram and ultrasound in March 2025 demonstrated stability in both these areas and she opted for continued follow-up with mammogram and ultrasound.  REVIEW OF SYSTEMS:  Review of Systems  Constitutional:  Negative for appetite change, chills, fatigue, fever and unexpected weight change.  HENT:   Negative for hearing loss, lump/mass and trouble swallowing.   Eyes:  Negative for eye problems and icterus.  Respiratory:  Negative for chest tightness, cough and shortness of breath.   Cardiovascular:  Negative for chest pain, leg swelling and palpitations.  Gastrointestinal:  Negative for abdominal distention, abdominal pain, constipation, diarrhea, nausea and vomiting.  Endocrine: Negative for hot flashes.  Genitourinary:  Negative for difficulty urinating.   Musculoskeletal:  Negative for arthralgias.  Skin:  Negative for itching and rash.  Neurological:  Negative for dizziness, extremity weakness, headaches and numbness.  Hematological:  Negative for adenopathy. Does not bruise/bleed easily.  Psychiatric/Behavioral:  Negative for depression. The patient is not nervous/anxious.   Breast: Denies any new nodularity, masses, tenderness, nipple  changes, or nipple discharge.       PAST MEDICAL/SURGICAL HISTORY:  Past Medical History:  Diagnosis Date   Anemia    took Iron supplements    Cancer (HCC)    breast cancer   Family history of adverse reaction to anesthesia    mom hard to wake up after anesthesia   Past Surgical History:  Procedure Laterality Date   BREAST BIOPSY  09/15/2022   MM RT RADIOACTIVE SEED LOC MAMMO GUIDE 09/15/2022 [facility]   BREAST BIOPSY  09/15/2022   MM RT RADIOACTIVE SEED EA ADD LESION LOC MAMMO GUIDE 09/15/2022 [facility]   BREAST BIOPSY  09/15/2022   MM RT RADIOACTIVE SEED EA ADD LESION LOC MAMMO GUIDE 09/15/2022 [facility]   BREAST BIOPSY  09/15/2022   MM RT RADIOACTIVE SEED EA ADD LESION LOC MAMMO GUIDE 09/15/2022 [facility]   BREAST LUMPECTOMY WITH RADIOACTIVE SEED LOCALIZATION Right 09/16/2022   Procedure: RIGHT BREAST BRACKETED LUMPECTOMY WITH RADIOACTIVE SEED LOCALIZATION;  Surgeon: Vanderbilt Ned, MD;  Location: [facility];  Service: General;  Laterality: Right;   LAPAROSCOPIC BILATERAL SALPINGECTOMY Bilateral 06/14/2015   Procedure: LAPAROSCOPIC BILATERAL SALPINGECTOMY;  Surgeon: Dickie Carder, MD;  Location: [facility];  Service: Gynecology;  Laterality: Bilateral;   RE-EXCISION OF BREAST LUMPECTOMY Right 10/07/2022   Procedure: RE-EXCISION OF RIGHT BREAST LUMPECTOMY;  Surgeon: Vanderbilt Ned, MD;  Location: [facility];  Service: General;  Laterality: Right;   ROBOTIC ASSISTED TOTAL HYSTERECTOMY N/A 06/14/2015   Procedure: ROBOTIC ASSISTED TOTAL HYSTERECTOMY With Morcellation;  Surgeon: Dickie Carder, MD;  Location: [facility];  Service: Gynecology;  Laterality: N/A;   SENTINEL NODE BIOPSY Right 10/07/2022   Procedure: RIGHT SENTINEL NODE BIOPSY;  Surgeon: Vanderbilt Ned, MD;  Location: [facility];  Service: General;  Laterality: Right;     ALLERGIES:  Allergies  Allergen Reactions   Chlorhexidine  Rash   Latex Rash     CURRENT MEDICATIONS:  Outpatient  Encounter Medications as of 05/18/2024  Medication Sig   ascorbic acid (VITAMIN C) 100 MG tablet Take 100 mg by mouth daily.   B Complex Vitamins (VITAMIN B COMPLEX PO) Take 1 capsule by mouth daily.   ELDERBERRY PO Take 1 capsule by mouth daily.   ERGOCALCIFEROL PO Take 1 capsule by mouth daily.   MAGNESIUM OXIDE, ELEMENTAL, PO Take 1 tablet by mouth daily.   Multiple Vitamin (MULTIVITAMIN WITH MINERALS) TABS tablet Take 1 tablet by mouth daily.   tamoxifen  (NOLVADEX ) 20 MG tablet Take 1 tablet (20 mg total) by mouth daily.   acetaminophen  (TYLENOL ) 500 MG tablet Take 500 mg by mouth every 6 (six) hours as needed for mild pain. (Patient not taking: Reported on 05/18/2024)   ibuprofen  (ADVIL ) 800 MG tablet Take 1 tablet (800 mg total) by mouth every 8 (eight) hours as needed. (Patient not taking: Reported on 05/18/2024)   No facility-administered encounter medications on file as of 05/18/2024.     ONCOLOGIC FAMILY HISTORY:  Family History  Problem Relation Age of Onset   Pancreatic cancer Paternal Aunt    Breast cancer Paternal Aunt        dx 67s   Breast cancer Paternal Grandmother        dx 1s     SOCIAL HISTORY:  Social History   Socioeconomic History   Marital status: Married    Spouse name: Not on file  Number of children: Not on file   Years of education: Not on file   Highest education level: Not on file  Occupational History   Not on file  Tobacco Use   Smoking status: Never   Smokeless tobacco: Never  Vaping Use   Vaping status: Never Used  Substance and Sexual Activity   Alcohol use: No   Drug use: No   Sexual activity: Yes    Birth control/protection: Surgical  Other Topics Concern   Not on file  Social History Narrative   Not on file   Social Drivers of Health   Financial Resource Strain: Not on file  Food Insecurity: Low Risk  (06/02/2023)   Received from [facility]   Hunger Vital Sign    Within the past 12 months, you worried that your food would  run out before you got money to buy more: Never true    Within the past 12 months, the food you bought just didn't last and you didn't have money to get more. : Never true  Transportation Needs: No Transportation Needs (06/02/2023)   Received from [facility]    In the past 12 months, has lack of reliable transportation kept you from medical appointments, meetings, work or from getting things needed for daily living? : No  Physical Activity: Not on file  Stress: Not on file  Social Connections: Not on file  Intimate Partner Violence: Not At Risk (12/04/2022)   Humiliation, Afraid, Rape, and Kick questionnaire    Fear of Current or Ex-Partner: No    Emotionally Abused: No    Physically Abused: No    Sexually Abused: No     OBSERVATIONS/OBJECTIVE:  BP (!) 140/81 (BP Location: Left Arm, Patient Position: Sitting)   Pulse (!) 106   Temp 98 F (36.7 C) (Temporal)   Resp 17   Wt 209 lb 14.4 oz (95.2 kg)   LMP  (LMP Unknown)   SpO2 100%   BMI 32.87 kg/m  GENERAL: Patient is a well appearing female in no acute distress HEENT:  Sclerae anicteric.  Oropharynx clear and moist. No ulcerations or evidence of oropharyngeal candidiasis. Neck is supple.  NODES:  No cervical, supraclavicular, or axillary lymphadenopathy palpated.  BREAST EXAM:  Right breast s/p lumpectomy and radiation, no sign of local recurrence, left breast is benign LUNGS:  Clear to auscultation bilaterally.  No wheezes or rhonchi. HEART:  Regular rate and rhythm. No murmur appreciated. ABDOMEN:  Soft, nontender.  Positive, normoactive bowel sounds. No organomegaly palpated. MSK:  No focal spinal tenderness to palpation. Full range of motion bilaterally in the upper extremities. EXTREMITIES:  No peripheral edema.   SKIN:  Clear with no obvious rashes or skin changes. No nail dyscrasia. NEURO:  Nonfocal. Well oriented.  Appropriate affect.   LABORATORY DATA:  None for this visit.  DIAGNOSTIC IMAGING:   None for this visit.      ASSESSMENT AND PLAN:  Ms.. Fisher is a pleasant 53 y.o. 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  She presents to the Survivorship Clinic for our initial meeting and routine follow-up post-completion of treatment for breast cancer.    1. Stage IA right breast cancer:  Ms. Vinal is continuing to recover from definitive treatment for breast cancer. She will follow-up with her medical oncologist, Dr.  Loretha in 6 months with history and physical  exam per surveillance protocol.  She will continue her anti-estrogen therapy with Tamoxifen . Thus far, she is tolerating the Tamoxifen  well, with minimal side effects. Her mammogram is due 08/2024. Today, a comprehensive survivorship care plan and treatment summary was reviewed with the patient today detailing her breast cancer diagnosis, treatment course, potential late/long-term effects of treatment, appropriate follow-up care with recommendations for the future, and patient education resources.  A copy of this summary, along with a letter will be sent to the patient's primary care provider via mail/fax/In Basket message after today's visit.    2. Bone health:    She was given education on specific activities to promote bone health.  3. Cancer screening:  Due to Ms. Kueker's history and her age, she should receive screening for skin cancers, colon cancer, and gynecologic cancers.  The information and recommendations are listed on the patient's comprehensive care plan/treatment summary and were reviewed in detail with the patient.    4. Health maintenance and wellness promotion: Ms. Berkley was encouraged to consume 5-7 servings of fruits and vegetables per day. We reviewed the [employer] handout.  She was also encouraged to engage in moderate to  vigorous exercise for 30 minutes per day most days of the week.  She was instructed to limit her alcohol consumption and continue to abstain from tobacco use.     5. Support services/counseling: It is not uncommon for this period of the patient's cancer care trajectory to be one of many emotions and stressors.   She was given information regarding our available services and encouraged to contact me with any questions or for help enrolling in any of our support group/programs.    Follow up instructions:    -Return to cancer center in 6 months for f/u with Dr. Odean  -Mammogram due in 08/2024 -She is welcome to return back to the Survivorship Clinic at any time; no additional follow-up needed at this time.  -Consider referral back to survivorship as a long-term survivor for continued surveillance  The patient was provided an opportunity to ask questions and all were answered. The patient agreed with the plan and demonstrated an understanding of the instructions.   Total encounter time:45 minutes*in face-to-face visit time, chart review, lab review, care coordination, order entry, and documentation of the encounter time.    Morna Kendall, NP 05/22/24 11:24 AM Medical Oncology and Hematology [facility] [address] [place], [place] [address] Tel. [phone]    Fax. [phone]  *Total Encounter Time as defined by the Centers for Medicare and Medicaid Services includes, in addition to the face-to-face time of a patient visit (documented in the note above) non-face-to-face time: obtaining and reviewing outside history, ordering and reviewing medications, tests or procedures, care coordination (communications with other health care professionals or caregivers) and documentation in the medical record.

## 2024-05-22 ENCOUNTER — Encounter: Payer: Self-pay | Admitting: Adult Health

## 2024-05-22 NOTE — Assessment & Plan Note (Signed)
 Mercedes Fisher is a 53 year old woman with stage Ia ER/PR positive right breast invasive lobular and invasive ductal carcinoma diagnosed in January 2024 status postlumpectomy, reexcision, adjuvant radiation, and tamoxifen  that began in August 2024.  Stage Ia breast cancer: Recommended she continue on tamoxifen  daily and continue annual mammograms.  I also recommend that she continue follow-up with mammography recommendations as received from [facility]. I also counseled her on healthy diet and exercise.  She was recommended to return in 6 months time for follow-up and evaluation with Dr. Loretha.

## 2024-10-11 ENCOUNTER — Other Ambulatory Visit: Payer: Self-pay | Admitting: *Deleted

## 2024-10-11 DIAGNOSIS — D0510 Intraductal carcinoma in situ of unspecified breast: Secondary | ICD-10-CM

## 2024-10-11 MED ORDER — TAMOXIFEN CITRATE 20 MG PO TABS: 20.0000 mg | ORAL_TABLET | Freq: Every day | ORAL | 3 refills | Status: AC

## 2024-11-15 ENCOUNTER — Inpatient Hospital Stay: Admitting: Hematology and Oncology
